# Patient Record
Sex: Female | Born: 1980 | Race: Black or African American | Hispanic: No | Marital: Married | Smoking: Never smoker
Health system: Southern US, Community
[De-identification: ages and names within clinical notes are randomized; demographics above are authoritative.]

## PROBLEM LIST (undated history)

## (undated) DIAGNOSIS — D573 Sickle-cell trait: Secondary | ICD-10-CM

## (undated) DIAGNOSIS — J45909 Unspecified asthma, uncomplicated: Secondary | ICD-10-CM

## (undated) DIAGNOSIS — D649 Anemia, unspecified: Secondary | ICD-10-CM

## (undated) DIAGNOSIS — D219 Benign neoplasm of connective and other soft tissue, unspecified: Secondary | ICD-10-CM

## (undated) HISTORY — DX: Benign neoplasm of connective and other soft tissue, unspecified: D21.9

## (undated) HISTORY — DX: Anemia, unspecified: D64.9

## (undated) HISTORY — PX: WISDOM TOOTH EXTRACTION: SHX21

## (undated) HISTORY — DX: Unspecified asthma, uncomplicated: J45.909

## (undated) HISTORY — PX: OTHER SURGICAL HISTORY: SHX169

## (undated) HISTORY — DX: Sickle-cell trait: D57.3

---

## 2019-04-03 NOTE — L&D Delivery Note (Signed)
Delivery Summary for Sharon Norman  Labor Events:   Preterm labor: No data found  Rupture date: 11/01/2019  Rupture time: 3:51 AM  Rupture type: Spontaneous Artificial Intact  Fluid Color: Clear Moderate Meconium  Induction: No data found  Augmentation: No data found  Complications: No data found  Cervical ripening: No data found No data found   No data found     Delivery:   Episiotomy: No data found  Lacerations: No data found  Repair suture: No data found  Repair # of packets: No data found  Blood loss (ml): 1083   Information for the patient's newborn:  Pallas, Wahlert [[id number]]    Delivery 11/01/2019 11:13 PM by  C-Section, Low Transverse Sex:  female Gestational Age: [redacted]w[redacted]d Delivery Clinician:   Living?:         APGARS  One minute Five minutes Ten minutes  Skin color:        Heart rate:        Grimace:        Muscle tone:        Breathing:        Totals: 8  8      Presentation/position:      Resuscitation:   Cord information:    Disposition of cord blood:     Blood gases sent?  Complications:   Placenta: Delivered:       appearance Newborn Measurements: Weight: 7 lb 11.1 oz (3490 g)  Height: 20.87"  Head circumference:    Chest circumference:    Other providers:    Additional  information: Forceps:   Vacuum:   Breech:   Observed anomalies        See Dr. Andreas Blower operative note for details of Cesarean delivery.    Rubie Maid, MD [facility]

## 2019-04-06 ENCOUNTER — Encounter: Payer: Self-pay | Admitting: Certified Nurse Midwife

## 2019-04-06 ENCOUNTER — Ambulatory Visit (INDEPENDENT_AMBULATORY_CARE_PROVIDER_SITE_OTHER): Payer: Self-pay | Admitting: Certified Nurse Midwife

## 2019-04-06 ENCOUNTER — Other Ambulatory Visit: Payer: Self-pay

## 2019-04-06 VITALS — BP 130/80 | HR 109 | Ht 63.5 in | Wt 144.3 lb

## 2019-04-06 DIAGNOSIS — Z113 Encounter for screening for infections with a predominantly sexual mode of transmission: Secondary | ICD-10-CM

## 2019-04-06 DIAGNOSIS — Z0283 Encounter for blood-alcohol and blood-drug test: Secondary | ICD-10-CM

## 2019-04-06 DIAGNOSIS — Z3401 Encounter for supervision of normal first pregnancy, first trimester: Secondary | ICD-10-CM

## 2019-04-06 MED ORDER — ONDANSETRON HCL 4 MG PO TABS: 4.0000 mg | ORAL_TABLET | Freq: Three times a day (TID) | ORAL | 0 refills | Status: DC | PRN

## 2019-04-06 NOTE — Progress Notes (Signed)
      Sharon Norman presents for NOB nurse intake visit. Pregnancy confirmation done at [facility], 03/09/19, with Leroy Sea, RN.  G1.  P0.  LMP 01/18/19.  EDD 10/29/2019.  Ga [redacted]w[redacted]d. Pregnancy education material explained and given.  0 cats in the home.  NOB labs ordered. BMI less than 30. TSH/HbgA1c not ordered. Sickle cell order due to race. Pt has Sickle Cell Trait. HIV and drug screen explained and ordered. Genetic screening discussed. Genetic testing; Ordered. Pt had Natera done during nurse intake.  PNV encouraged. Pt to follow up with provider in 2 weeks for NOB physical. [place] reviewed with pt. FMLA form signed and reviewed with pt.    BP 130/80   Pulse (!) 109   Ht 5' 3.5" (1.613 m)   Wt 144 lb 4.8 oz (65.5 kg)   LMP 01/18/2019   BMI 25.16 kg/m

## 2019-04-06 NOTE — Patient Instructions (Signed)
WHAT OB PATIENTS CAN EXPECT   Confirmation of pregnancy and ultrasound ordered if medically indicated-[redacted] weeks gestation  New OB (NOB) intake with nurse and New OB (NOB) labs- [redacted] weeks gestation  New OB (NOB) physical examination with provider- 11/[redacted] weeks gestation  Flu vaccine-[redacted] weeks gestation  Anatomy scan-[redacted] weeks gestation  Glucose tolerance test, blood work to test for anemia, T-dap vaccine-[redacted] weeks gestation  Vaginal swabs/cultures-STD/Group B strep-[redacted] weeks gestation  Appointments every 4 weeks until 28 weeks  Every 2 weeks from 28 weeks until 36 weeks  Weekly visits from 36 weeks until delivery  Morning Sickness  Morning sickness is when you feel sick to your stomach (nauseous) during pregnancy. You may feel sick to your stomach and throw up (vomit). You may feel sick in the morning, but you can feel this way at any time of day. Some women feel very sick to their stomach and cannot stop throwing up (hyperemesis gravidarum). Follow these instructions at home: Medicines  Take over-the-counter and prescription medicines only as told by your doctor. Do not take any medicines until you talk with your doctor about them first.  Taking multivitamins before getting pregnant can stop or lessen the harshness of morning sickness. Eating and drinking  Eat dry toast or crackers before getting out of bed.  Eat 5 or 6 small meals a day.  Eat dry and bland foods like rice and baked potatoes.  Do not eat greasy, fatty, or spicy foods.  Have someone cook for you if the smell of food causes you to feel sick or throw up.  If you feel sick to your stomach after taking prenatal vitamins, take them at night or with a snack.  Eat protein when you need a snack. Nuts, yogurt, and cheese are good choices.  Drink fluids throughout the day.  Try ginger ale made with real ginger, ginger tea made from fresh grated ginger, or ginger candies. General instructions  Do not use any products  that have nicotine or tobacco in them, such as cigarettes and e-cigarettes. If you need help quitting, ask your doctor.  Use an air purifier to keep the air in your house free of smells.  Get lots of fresh air.  Try to avoid smells that make you feel sick.  Try: ? Wearing a bracelet that is used for seasickness (acupressure wristband). ? Going to a doctor who puts thin needles into certain body points (acupuncture) to improve how you feel. Contact a doctor if:  You need medicine to feel better.  You feel dizzy or light-headed.  You are losing weight. Get help right away if:  You feel very sick to your stomach and cannot stop throwing up.  You pass out (faint).  You have very bad pain in your belly. Summary  Morning sickness is when you feel sick to your stomach (nauseous) during pregnancy.  You may feel sick in the morning, but you can feel this way at any time of day.  Making some changes to what you eat may help your symptoms go away. This information is not intended to replace advice given to you by your health care provider. Make sure you discuss any questions you have with your health care provider. Document Revised: 03/01/2017 Document Reviewed: 04/19/2016 Elsevier Patient Education  2020 [employer]. How a Baby Grows During Pregnancy  Pregnancy begins when a female's sperm enters a female's egg (fertilization). Fertilization usually happens in one of the tubes (fallopian tubes) that connect the ovaries to the  womb (uterus). The fertilized egg moves down the fallopian tube to the uterus. Once it reaches the uterus, it implants into the lining of the uterus and begins to grow. For the first 10 weeks, the fertilized egg is called an embryo. After 10 weeks, it is called a fetus. As the fetus continues to grow, it receives oxygen and nutrients through tissue (placenta) that grows to support the developing baby. The placenta is the life support system for the baby. It provides  oxygen and nutrition and removes waste. Learning as much as you can about your pregnancy and how your baby is developing can help you enjoy the experience. It can also make you aware of when there might be a problem and when to ask questions. How long does a typical pregnancy last? A pregnancy usually lasts 280 days, or about 40 weeks. Pregnancy is divided into three periods of growth, also called trimesters:  First trimester: 0-12 weeks.  Second trimester: 13-27 weeks.  Third trimester: 28-40 weeks. The day when your baby is ready to be born (full term) is your estimated date of delivery. How does my baby develop month by month? First month  The fertilized egg attaches to the inside of the uterus.  Some cells will form the placenta. Others will form the fetus.  The arms, legs, brain, spinal cord, lungs, and heart begin to develop.  At the end of the first month, the heart begins to beat. Second month  The bones, inner ear, eyelids, hands, and feet form.  The genitals develop.  By the end of 8 weeks, all major organs are developing. Third month  All of the internal organs are forming.  Teeth develop below the gums.  Bones and muscles begin to grow. The spine can flex.  The skin is transparent.  Fingernails and toenails begin to form.  Arms and legs continue to grow longer, and hands and feet develop.  The fetus is about 3 inches (7.6 cm) long. Fourth month  The placenta is completely formed.  The external sex organs, neck, outer ear, eyebrows, eyelids, and fingernails are formed.  The fetus can hear, swallow, and move its arms and legs.  The kidneys begin to produce urine.  The skin is covered with a white, waxy coating (vernix) and very fine hair (lanugo). Fifth month  The fetus moves around more and can be felt for the first time (quickening).  The fetus starts to sleep and wake up and may begin to suck its finger.  The nails grow to the end of the  fingers.  The organ in the digestive system that makes bile (gallbladder) functions and helps to digest nutrients.  If your baby is a girl, eggs are present in her ovaries. If your baby is a boy, testicles start to move down into his scrotum. Sixth month  The lungs are formed.  The eyes open. The brain continues to develop.  Your baby has fingerprints and toe prints. Your baby's hair grows thicker.  At the end of the second trimester, the fetus is about 9 inches (22.9 cm) long. Seventh month  The fetus kicks and stretches.  The eyes are developed enough to sense changes in light.  The hands can make a grasping motion.  The fetus responds to sound. Eighth month  All organs and body systems are fully developed and functioning.  Bones harden, and taste buds develop. The fetus may hiccup.  Certain areas of the brain are still developing. The skull remains soft.   Ninth month  The fetus gains about  lb (0.23 kg) each week.  The lungs are fully developed.  Patterns of sleep develop.  The fetus's head typically moves into a head-down position (vertex) in the uterus to prepare for birth.  The fetus weighs 6-9 lb (2.72-4.08 kg) and is 19-20 inches (48.26-50.8 cm) long. What can I do to have a healthy pregnancy and help my baby develop? General instructions  Take prenatal vitamins as directed by your health care provider. These include vitamins such as folic acid, iron, calcium, and vitamin D. They are important for healthy development.  Take medicines only as directed by your health care provider. Read labels and ask a pharmacist or your health care provider whether over-the-counter medicines, supplements, and prescription drugs are safe to take during pregnancy.  Keep all follow-up visits as directed by your health care provider. This is important. Follow-up visits include prenatal care and screening tests. How do I know if my baby is developing well? At each prenatal visit,  your health care provider will do several different tests to check on your health and keep track of your baby's development. These include:  Fundal height and position. ? Your health care provider will measure your growing belly from your pubic bone to the top of the uterus using a tape measure. ? Your health care provider will also feel your belly to determine your baby's position.  Heartbeat. ? An ultrasound in the first trimester can confirm pregnancy and show a heartbeat, depending on how far along you are. ? Your health care provider will check your baby's heart rate at every prenatal visit.  Second trimester ultrasound. ? This ultrasound checks your baby's development. It also may show your baby's gender. What should I do if I have concerns about my baby's development? Always talk with your health care provider about any concerns that you may have about your pregnancy and your baby. Summary  A pregnancy usually lasts 280 days, or about 40 weeks. Pregnancy is divided into three periods of growth, also called trimesters.  Your health care provider will monitor your baby's growth and development throughout your pregnancy.  Follow your health care provider's recommendations about taking prenatal vitamins and medicines during your pregnancy.  Talk with your health care provider if you have any concerns about your pregnancy or your developing baby. This information is not intended to replace advice given to you by your health care provider. Make sure you discuss any questions you have with your health care provider. Document Revised: 07/10/2018 Document Reviewed: 01/30/2017 Elsevier Patient Education  2020 [place] of Pregnancy  The first trimester of pregnancy is from week 1 until the end of week 13 (months 1 through 3). During this time, your baby will begin to develop inside you. At 6-8 weeks, the eyes and face are formed, and the heartbeat can be seen on  ultrasound. At the end of 12 weeks, all the baby's organs are formed. Prenatal care is all the medical care you receive before the birth of your baby. Make sure you get good prenatal care and follow all of your doctor's instructions. Follow these instructions at home: Medicines  Take over-the-counter and prescription medicines only as told by your doctor. Some medicines are safe and some medicines are not safe during pregnancy.  Take a prenatal vitamin that contains at least 600 micrograms (mcg) of folic acid.  If you have trouble pooping (constipation), take medicine that will make your stool soft (stool  softener) if your doctor approves. Eating and drinking   Eat regular, healthy meals.  Your doctor will tell you the amount of weight gain that is right for you.  Avoid raw meat and uncooked cheese.  If you feel sick to your stomach (nauseous) or throw up (vomit): ? Eat 4 or 5 small meals a day instead of 3 large meals. ? Try eating a few soda crackers. ? Drink liquids between meals instead of during meals.  To prevent constipation: ? Eat foods that are high in fiber, like fresh fruits and vegetables, whole grains, and beans. ? Drink enough fluids to keep your pee (urine) clear or pale yellow. Activity  Exercise only as told by your doctor. Stop exercising if you have cramps or pain in your lower belly (abdomen) or low back.  Do not exercise if it is too hot, too humid, or if you are in a place of great height (high altitude).  Try to avoid standing for long periods of time. Move your legs often if you must stand in one place for a long time.  Avoid heavy lifting.  Wear low-heeled shoes. Sit and stand up straight.  You can have sex unless your doctor tells you not to. Relieving pain and discomfort  Wear a good support bra if your breasts are sore.  Take warm water baths (sitz baths) to soothe pain or discomfort caused by hemorrhoids. Use hemorrhoid cream if your doctor says  it is okay.  Rest with your legs raised if you have leg cramps or low back pain.  If you have puffy, bulging veins (varicose veins) in your legs: ? Wear support hose or compression stockings as told by your doctor. ? Raise (elevate) your feet for 15 minutes, 3-4 times a day. ? Limit salt in your food. Prenatal care  Schedule your prenatal visits by the twelfth week of pregnancy.  Write down your questions. Take them to your prenatal visits.  Keep all your prenatal visits as told by your doctor. This is important. Safety  Wear your seat belt at all times when driving.  Make a list of emergency phone numbers. The list should include numbers for family, friends, the hospital, and police and fire departments. General instructions  Ask your doctor for a referral to a local prenatal class. Begin classes no later than at the start of month 6 of your pregnancy.  Ask for help if you need counseling or if you need help with nutrition. Your doctor can give you advice or tell you where to go for help.  Do not use hot tubs, steam rooms, or saunas.  Do not douche or use tampons or scented sanitary pads.  Do not cross your legs for long periods of time.  Avoid all herbs and alcohol. Avoid drugs that are not approved by your doctor.  Do not use any tobacco products, including cigarettes, chewing tobacco, and electronic cigarettes. If you need help quitting, ask your doctor. You may get counseling or other support to help you quit.  Avoid cat litter boxes and soil used by cats. These carry germs that can cause birth defects in the baby and can cause a loss of your baby (miscarriage) or stillbirth.  Visit your dentist. At home, brush your teeth with a soft toothbrush. Be gentle when you floss. Contact a doctor if:  You are dizzy.  You have mild cramps or pressure in your lower belly.  You have a nagging pain in your belly area.  You  continue to feel sick to your stomach, you throw up, or  you have watery poop (diarrhea).  You have a bad smelling fluid coming from your vagina.  You have pain when you pee (urinate).  You have increased puffiness (swelling) in your face, hands, legs, or ankles. Get help right away if:  You have a fever.  You are leaking fluid from your vagina.  You have spotting or bleeding from your vagina.  You have very bad belly cramping or pain.  You gain or lose weight rapidly.  You throw up blood. It may look like coffee grounds.  You are around people who have [place] measles, fifth disease, or chickenpox.  You have a very bad headache.  You have shortness of breath.  You have any kind of trauma, such as from a fall or a car accident. Summary  The first trimester of pregnancy is from week 1 until the end of week 13 (months 1 through 3).  To take care of yourself and your unborn baby, you will need to eat healthy meals, take medicines only if your doctor tells you to do so, and do activities that are safe for you and your baby.  Keep all follow-up visits as told by your doctor. This is important as your doctor will have to ensure that your baby is healthy and growing well. This information is not intended to replace advice given to you by your health care provider. Make sure you discuss any questions you have with your health care provider. Document Revised: 07/10/2018 Document Reviewed: 03/27/2016 Elsevier Patient Education  [place] of Pregnancy  The first trimester of pregnancy is from week 1 until the end of week 13 (months 1 through 3). During this time, your baby will begin to develop inside you. At 6-8 weeks, the eyes and face are formed, and the heartbeat can be seen on ultrasound. At the end of 12 weeks, all the baby's organs are formed. Prenatal care is all the medical care you receive before the birth of your baby. Make sure you get good prenatal care and follow all of your doctor's instructions. Follow  these instructions at home: Medicines  Take over-the-counter and prescription medicines only as told by your doctor. Some medicines are safe and some medicines are not safe during pregnancy.  Take a prenatal vitamin that contains at least 600 micrograms (mcg) of folic acid.  If you have trouble pooping (constipation), take medicine that will make your stool soft (stool softener) if your doctor approves. Eating and drinking   Eat regular, healthy meals.  Your doctor will tell you the amount of weight gain that is right for you.  Avoid raw meat and uncooked cheese.  If you feel sick to your stomach (nauseous) or throw up (vomit): ? Eat 4 or 5 small meals a day instead of 3 large meals. ? Try eating a few soda crackers. ? Drink liquids between meals instead of during meals.  To prevent constipation: ? Eat foods that are high in fiber, like fresh fruits and vegetables, whole grains, and beans. ? Drink enough fluids to keep your pee (urine) clear or pale yellow. Activity  Exercise only as told by your doctor. Stop exercising if you have cramps or pain in your lower belly (abdomen) or low back.  Do not exercise if it is too hot, too humid, or if you are in a place of great height (high altitude).  Try to avoid standing for long periods  of time. Move your legs often if you must stand in one place for a long time.  Avoid heavy lifting.  Wear low-heeled shoes. Sit and stand up straight.  You can have sex unless your doctor tells you not to. Relieving pain and discomfort  Wear a good support bra if your breasts are sore.  Take warm water baths (sitz baths) to soothe pain or discomfort caused by hemorrhoids. Use hemorrhoid cream if your doctor says it is okay.  Rest with your legs raised if you have leg cramps or low back pain.  If you have puffy, bulging veins (varicose veins) in your legs: ? Wear support hose or compression stockings as told by your doctor. ? Raise (elevate)  your feet for 15 minutes, 3-4 times a day. ? Limit salt in your food. Prenatal care  Schedule your prenatal visits by the twelfth week of pregnancy.  Write down your questions. Take them to your prenatal visits.  Keep all your prenatal visits as told by your doctor. This is important. Safety  Wear your seat belt at all times when driving.  Make a list of emergency phone numbers. The list should include numbers for family, friends, the hospital, and police and fire departments. General instructions  Ask your doctor for a referral to a local prenatal class. Begin classes no later than at the start of month 6 of your pregnancy.  Ask for help if you need counseling or if you need help with nutrition. Your doctor can give you advice or tell you where to go for help.  Do not use hot tubs, steam rooms, or saunas.  Do not douche or use tampons or scented sanitary pads.  Do not cross your legs for long periods of time.  Avoid all herbs and alcohol. Avoid drugs that are not approved by your doctor.  Do not use any tobacco products, including cigarettes, chewing tobacco, and electronic cigarettes. If you need help quitting, ask your doctor. You may get counseling or other support to help you quit.  Avoid cat litter boxes and soil used by cats. These carry germs that can cause birth defects in the baby and can cause a loss of your baby (miscarriage) or stillbirth.  Visit your dentist. At home, brush your teeth with a soft toothbrush. Be gentle when you floss. Contact a doctor if:  You are dizzy.  You have mild cramps or pressure in your lower belly.  You have a nagging pain in your belly area.  You continue to feel sick to your stomach, you throw up, or you have watery poop (diarrhea).  You have a bad smelling fluid coming from your vagina.  You have pain when you pee (urinate).  You have increased puffiness (swelling) in your face, hands, legs, or ankles. Get help right away  if:  You have a fever.  You are leaking fluid from your vagina.  You have spotting or bleeding from your vagina.  You have very bad belly cramping or pain.  You gain or lose weight rapidly.  You throw up blood. It may look like coffee grounds.  You are around people who have [place] measles, fifth disease, or chickenpox.  You have a very bad headache.  You have shortness of breath.  You have any kind of trauma, such as from a fall or a car accident. Summary  The first trimester of pregnancy is from week 1 until the end of week 13 (months 1 through 3).  To take care  of yourself and your unborn baby, you will need to eat healthy meals, take medicines only if your doctor tells you to do so, and do activities that are safe for you and your baby.  Keep all follow-up visits as told by your doctor. This is important as your doctor will have to ensure that your baby is healthy and growing well. This information is not intended to replace advice given to you by your health care provider. Make sure you discuss any questions you have with your health care provider. Document Revised: 07/10/2018 Document Reviewed: 03/27/2016 Elsevier Patient Education  2020 [employer]. Commonly Asked Questions During Pregnancy  Cats: A parasite can be excreted in cat feces.  To avoid exposure you need to have another person empty the little box.  If you must empty the litter box you will need to wear gloves.  Wash your hands after handling your cat.  This parasite can also be found in raw or undercooked meat so this should also be avoided.  Colds, Sore Throats, Flu: Please check your medication sheet to see what you can take for symptoms.  If your symptoms are unrelieved by these medications please call the office.  Dental Work: Most any dental work [employer] recommends is permitted.  X-rays should only be taken during the first trimester if absolutely necessary.  Your abdomen should be shielded with a  lead apron during all x-rays.  Please notify your provider prior to receiving any x-rays.  Novocaine is fine; gas is not recommended.  If your dentist requires a note from [place] prior to dental work please call the office and we will provide one for you.  Exercise: Exercise is an important part of staying healthy during your pregnancy.  You may continue most exercises you were accustomed to prior to pregnancy.  Later in your pregnancy you will most likely notice you have difficulty with activities requiring balance like riding a bicycle.  It is important that you listen to your body and avoid activities that put you at a higher risk of falling.  Adequate rest and staying well hydrated are a must!  If you have questions about the safety of specific activities ask your provider.    Exposure to Children with illness: Try to avoid obvious exposure; report any symptoms to [place] when noted,  If you have chicken pos, red measles or mumps, you should be immune to these diseases.   Please do not take any vaccines while pregnant unless you have checked with your OB provider.  Fetal Movement: After 28 weeks we recommend you do "kick counts" twice daily.  Lie or sit down in a calm quiet environment and count your baby movements "kicks".  You should feel your baby at least 10 times per hour.  If you have not felt 10 kicks within the first hour get up, walk around and have something sweet to eat or drink then repeat for an additional hour.  If count remains less than 10 per hour notify your provider.  Fumigating: Follow your pest control agent's advice as to how long to stay out of your home.  Ventilate the area well before re-entering.  Hemorrhoids:   Most over-the-counter preparations can be used during pregnancy.  Check your medication to see what is safe to use.  It is important to use a stool softener or fiber in your diet and to drink lots of liquids.  If hemorrhoids seem to be getting worse please call the office.  Hot Tubs:  Hot tubs Jacuzzis and saunas are not recommended while pregnant.  These increase your internal body temperature and should be avoided.  Intercourse:  Sexual intercourse is safe during pregnancy as long as you are comfortable, unless otherwise advised by your provider.  Spotting may occur after intercourse; report any bright red bleeding that is heavier than spotting.  Labor:  If you know that you are in labor, please go to the hospital.  If you are unsure, please call the office and let us help you decide what to do.  Lifting, straining, etc:  If your job requires heavy lifting or straining please check with your provider for any limitations.  Generally, you should not lift items heavier than that you can lift simply with your hands and arms (no back muscles)  Painting:  Paint fumes do not harm your pregnancy, but may make you ill and should be avoided if possible.  Latex or water based paints have less odor than oils.  Use adequate ventilation while painting.  Permanents & Hair Color:  Chemicals in hair dyes are not recommended as they cause increase hair dryness which can increase hair loss during pregnancy.  " Highlighting" and permanents are allowed.  Dye may be absorbed differently and permanents may not hold as well during pregnancy.  Sunbathing:  Use a sunscreen, as skin burns easily during pregnancy.  Drink plenty of fluids; avoid over heating.  Tanning Beds:  Because their possible side effects are still unknown, tanning beds are not recommended.  Ultrasound Scans:  Routine ultrasounds are performed at approximately 20 weeks.  You will be able to see your baby's general anatomy an if you would like to know the gender this can usually be determined as well.  If it is questionable when you conceived you may also receive an ultrasound early in your pregnancy for dating purposes.  Otherwise ultrasound exams are not routinely performed unless there is a medical necessity.  Although  you can request a scan we ask that you pay for it when conducted because insurance does not cover " patient request" scans.  Work: If your pregnancy proceeds without complications you may work until your due date, unless your physician or employer advises otherwise.  Round Ligament Pain/Pelvic Discomfort:  Sharp, shooting pains not associated with bleeding are fairly common, usually occurring in the second trimester of pregnancy.  They tend to be worse when standing up or when you remain standing for long periods of time.  These are the result of pressure of certain pelvic ligaments called "round ligaments".  Rest, Tylenol and heat seem to be the most effective relief.  As the womb and fetus grow, they rise out of the pelvis and the discomfort improves.  Please notify the office if your pain seems different than that described.  It may represent a more serious condition.  Common Medications Safe in Pregnancy  Acne:      Constipation:  Benzoyl Peroxide     Colace  Clindamycin      Dulcolax Suppository  Topica Erythromycin     Fibercon  Salicylic Acid      Metamucil         Miralax AVOID:        Senakot   Accutane    Cough:  Retin-A       Cough Drops  Tetracycline      Phenergan w/ Codeine if Rx  Minocycline      Robitussin (Plain & DM)  Antibiotics:  Crabs/Lice:  Ceclor       RID  Cephalosporins    AVOID:  E-Mycins      Kwell  Keflex  Macrobid/Macrodantin   Diarrhea:  Penicillin      Kao-Pectate  Zithromax      Imodium AD         PUSH FLUIDS AVOID:       Cipro     Fever:  Tetracycline      Tylenol (Regular or Extra  Minocycline       Strength)  Levaquin      Extra Strength-Do not          Exceed 8 tabs/24 hrs Caffeine:        '200mg'$ /day (equiv. To 1 cup of coffee or  approx. 3 12 oz sodas)         Gas: Cold/Hayfever:       Gas-X  Benadryl      Mylicon  Claritin       Phazyme  **Claritin-D        Chlor-Trimeton    Headaches:  Dimetapp      ASA-Free  Excedrin  Drixoral-Non-Drowsy     Cold Compress  Mucinex (Guaifenasin)     Tylenol (Regular or Extra  Sudafed/Sudafed-12 Hour     Strength)  **Sudafed PE Pseudoephedrine   Tylenol Cold & Sinus     Vicks Vapor Rub  Zyrtec  **AVOID if Problems With Blood Pressure         Heartburn: Avoid lying down for at least 1 hour after meals  Aciphex      Maalox     Rash:  Milk of Magnesia     Benadryl    Mylanta       1% Hydrocortisone Cream  Pepcid  Pepcid Complete   Sleep Aids:  Prevacid      Ambien   Prilosec       Benadryl  Rolaids       Chamomile Tea  Tums (Limit 4/day)     Unisom  Zantac       Tylenol PM         Warm milk-add vanilla or  Hemorrhoids:       Sugar for taste  Anusol/Anusol H.C.  (RX: Analapram 2.5%)  Sugar Substitutes:  Hydrocortisone OTC     Ok in moderation  Preparation H      Tucks        Vaseline lotion applied to tissue with wiping    Herpes:     Throat:  Acyclovir      Oragel  Famvir  Valtrex     Vaccines:         Flu Shot Leg Cramps:       *Gardasil  Benadryl      Hepatitis A         Hepatitis B Nasal Spray:       Pneumovax  Saline Nasal Spray     Polio Booster         Tetanus Nausea:       Tuberculosis test or PPD  Vitamin B6 25 mg TID   AVOID:    Dramamine      *Gardasil  Emetrol       Live Poliovirus  Ginger Root 250 mg QID    MMR (measles, mumps &  High Complex Carbs @ Bedtime    rebella)  Sea Bands-Accupressure    Varicella (Chickenpox)  Unisom 1/2 tab TID     *No known complications  If received before Pain:         Known pregnancy;   Darvocet       Resume series after  Lortab        Delivery  Percocet    Yeast:   Tramadol      Femstat  Tylenol 3      Gyne-lotrimin  Ultram       Monistat  Vicodin           MISC:         All Sunscreens           Hair Coloring/highlights          Insect Repellant's          (Including DEET)         Mystic Tans

## 2019-04-07 LAB — URINALYSIS, ROUTINE W REFLEX MICROSCOPIC
Bilirubin, UA: NEGATIVE
Glucose, UA: NEGATIVE
Ketones, UA: NEGATIVE
Leukocytes,UA: NEGATIVE
Nitrite, UA: NEGATIVE
Protein,UA: NEGATIVE
RBC, UA: NEGATIVE
Specific Gravity, UA: 1.019 (ref 1.005–1.030)
Urobilinogen, Ur: 0.2 mg/dL (ref 0.2–1.0)
pH, UA: 5 (ref 5.0–7.5)

## 2019-04-07 LAB — NICOTINE SCREEN, URINE: Cotinine Ql Scrn, Ur: NEGATIVE ng/mL

## 2019-04-07 LAB — DRUG PROFILE, UR, 9 DRUGS (LABCORP)
Amphetamines, Urine: NEGATIVE ng/mL
Barbiturate Quant, Ur: NEGATIVE ng/mL
Benzodiazepine Quant, Ur: NEGATIVE ng/mL
Cannabinoid Quant, Ur: NEGATIVE ng/mL
Cocaine (Metab.): NEGATIVE ng/mL
Methadone Screen, Urine: NEGATIVE ng/mL
Opiate Quant, Ur: NEGATIVE ng/mL
PCP Quant, Ur: NEGATIVE ng/mL
Propoxyphene: NEGATIVE ng/mL

## 2019-04-07 LAB — GC/CHLAMYDIA PROBE AMP
Chlamydia trachomatis, NAA: NEGATIVE
Neisseria Gonorrhoeae by PCR: NEGATIVE

## 2019-04-08 LAB — CULTURE, OB URINE

## 2019-04-08 LAB — HEMOGLOBIN FRAC.W/O SOLUBILITY
HGB C: 0 %
HGB S: 35.8 % — ABNORMAL HIGH
HGB VARIANT: 0 %
Hemoglobin A2 Quantitation: 4 % — ABNORMAL HIGH (ref 1.8–3.2)
Hemoglobin F Quantitation: 0 % (ref 0.0–2.0)
Hgb A: 60.2 % — ABNORMAL LOW (ref 96.4–98.8)

## 2019-04-08 LAB — OB RESULTS CONSOLE VARICELLA ZOSTER ANTIBODY, IGG: Varicella: NON-IMMUNE/NOT IMMUNE

## 2019-04-08 LAB — HGB SOLU + RFLX FRAC: Sickle Solubility Test - HGBRFX: POSITIVE — AB

## 2019-04-08 LAB — HIV ANTIBODY (ROUTINE TESTING W REFLEX): HIV Screen 4th Generation wRfx: NONREACTIVE

## 2019-04-08 LAB — URINE CULTURE, OB REFLEX

## 2019-04-08 LAB — ABO AND RH: Rh Factor: POSITIVE

## 2019-04-08 LAB — RPR: RPR Ser Ql: NONREACTIVE

## 2019-04-08 LAB — VARICELLA ZOSTER ANTIBODY, IGG: Varicella zoster IgG: <135 index — ABNORMAL LOW (ref >165)

## 2019-04-08 LAB — ANTIBODY SCREEN: Antibody Screen: NEGATIVE

## 2019-04-08 LAB — HEPATITIS B SURFACE ANTIGEN: Hepatitis B Surface Ag: NEGATIVE

## 2019-04-08 LAB — RUBELLA SCREEN: Rubella Antibodies, IGG: 1.49 index (ref >0.99)

## 2019-04-08 LAB — TOXOPLASMA ANTIBODIES- IGG AND  IGM
Toxoplasma Antibody- IgM: <3 AU/mL (ref 0.0–7.9)
Toxoplasma IgG Ratio: <3 IU/mL (ref 0.0–7.1)

## 2019-04-09 ENCOUNTER — Telehealth: Payer: Self-pay

## 2019-04-09 NOTE — Telephone Encounter (Signed)
Voicemail message left for patient- per AT positive for sickle cell disease. Informed her her partner needs to be tested- either at PCP or HD- also she is nonimmune to varicella and will need a booster after delivery.

## 2019-04-16 ENCOUNTER — Telehealth: Payer: Self-pay | Admitting: Certified Nurse Midwife

## 2019-04-16 NOTE — Telephone Encounter (Signed)
Pt alled in wanting to know the gender. Pt was told that she would have it by now .please advise

## 2019-04-17 ENCOUNTER — Telehealth: Payer: Self-pay

## 2019-04-17 NOTE — Telephone Encounter (Signed)
mychart message sent to patient- Sharon Norman results are still being processed according to [employer].

## 2019-04-20 ENCOUNTER — Telehealth: Payer: Self-pay

## 2019-04-20 NOTE — Telephone Encounter (Signed)
Pt called and aware of test results.

## 2019-04-21 ENCOUNTER — Ambulatory Visit (INDEPENDENT_AMBULATORY_CARE_PROVIDER_SITE_OTHER): Payer: Commercial Managed Care - PPO | Admitting: Certified Nurse Midwife

## 2019-04-21 ENCOUNTER — Encounter: Payer: Self-pay | Admitting: Certified Nurse Midwife

## 2019-04-21 ENCOUNTER — Other Ambulatory Visit: Payer: Self-pay

## 2019-04-21 VITALS — BP 137/75 | HR 108 | Wt 148.4 lb

## 2019-04-21 DIAGNOSIS — Z3401 Encounter for supervision of normal first pregnancy, first trimester: Secondary | ICD-10-CM

## 2019-04-21 DIAGNOSIS — Z124 Encounter for screening for malignant neoplasm of cervix: Secondary | ICD-10-CM

## 2019-04-21 LAB — POCT URINALYSIS DIPSTICK OB
Bilirubin, UA: NEGATIVE
Blood, UA: NEGATIVE
Glucose, UA: NEGATIVE
Ketones, UA: NEGATIVE
Leukocytes, UA: NEGATIVE
Nitrite, UA: NEGATIVE
POC,PROTEIN,UA: NEGATIVE
Spec Grav, UA: 1.015 (ref 1.010–1.025)
Urobilinogen, UA: 0.2 E.U./dL
pH, UA: 5 (ref 5.0–8.0)

## 2019-04-21 MED ORDER — FUSION PLUS PO CAPS: 1.0000 | ORAL_CAPSULE | Freq: Every day | ORAL | 3 refills | Status: AC

## 2019-04-21 NOTE — Lactation Note (Signed)
Lactation Consultation Note  Patient Name: Sharon Norman [id number] Date: 04/21/2019     Maternal Data    Feeding    LATCH Score                   Interventions    Lactation Tools Discussed/Used     Consult Status  LC Student, Levonne Hubert, went over benefits of breastfeeding and breastfeeding information per the Ready, Set baby curriculum.[name] asked how will she know if the baby is getting enough milk since she can not measure through breastfeeding. She also asked if she should feed on both breasts during a feed. Questions were answered and no further breastfeeding inquiries.    [name] [id number], 4:09 PM

## 2019-04-21 NOTE — Progress Notes (Signed)
NEW OB HISTORY AND PHYSICAL  SUBJECTIVE:       Sharon Norman is a 39 y.o. G1P0 female, Patient's last menstrual period was 01/18/2019., Estimated Date of Delivery: 10/29/19, [redacted]w[redacted]d, presents today for establishment of Prenatal Care. She has no unusual complaints.   Social: Married x 2 yrs, Lives with spouse No children Works: Environmental manager: none Denies smoking, drinking and drug use.     Gynecologic History Patient's last menstrual period was 01/18/2019. Normal Contraception: none Last Pap: 04/15/2014. Results were: normal  Obstetric History OB History  Gravida Para Term Preterm AB Living  1            SAB TAB Ectopic Multiple Live Births               # Outcome Date GA Lbr Len/2nd Weight Sex Delivery Anes PTL Lv  1 Current             Past Medical History:  Diagnosis Date  . Anemia   . Asthma   . Fibroid   . Sickle cell trait [facility])     Past Surgical History:  Procedure Laterality Date  . left ovary removed    . WISDOM TOOTH EXTRACTION      Current Outpatient Medications on File Prior to Visit  Medication Sig Dispense Refill  . ferrous sulfate 325 (65 FE) MG tablet Take 325 mg by mouth daily with breakfast.    . ondansetron (ZOFRAN) 4 MG tablet Take 1 tablet (4 mg total) by mouth every 8 (eight) hours as needed for nausea or vomiting. 20 tablet 0  . Prenatal Vit-Fe Fumarate-FA (PRENATAL MULTIVITAMIN) TABS tablet Take 1 tablet by mouth daily at 12 noon.     No current facility-administered medications on file prior to visit.    Allergies  Allergen Reactions  . Latex     Social History   Socioeconomic History  . Marital status: Married    Spouse name: Not on file  . Number of children: Not on file  . Years of education: Not on file  . Highest education level: Not on file  Occupational History  . Not on file  Tobacco Use  . Smoking status: Never Smoker  . Smokeless tobacco: Never Used  Substance and Sexual Activity  . Alcohol  use: Not Currently  . Drug use: Not Currently  . Sexual activity: Yes  Other Topics Concern  . Not on file  Social History Narrative  . Not on file   Social Determinants of Health   Financial Resource Strain:   . Difficulty of Paying Living Expenses: Not on file  Food Insecurity:   . Worried About Charity fundraiser in the Last Year: Not on file  . Ran Out of Food in the Last Year: Not on file  Transportation Needs:   . Lack of Transportation (Medical): Not on file  . Lack of Transportation (Non-Medical): Not on file  Physical Activity:   . Days of Exercise per Week: Not on file  . Minutes of Exercise per Session: Not on file  Stress:   . Feeling of Stress : Not on file  Social Connections:   . Frequency of Communication with Friends and Family: Not on file  . Frequency of Social Gatherings with Friends and Family: Not on file  . Attends Religious Services: Not on file  . Active Member of Clubs or Organizations: Not on file  . Attends Archivist Meetings: Not on file  . Marital Status:  Not on file  Intimate Partner Violence:   . Fear of Current or Ex-Partner: Not on file  . Emotionally Abused: Not on file  . Physically Abused: Not on file  . Sexually Abused: Not on file    Family History  Problem Relation Age of Onset  . Sickle cell anemia Mother   . Cataracts Mother   . Healthy Father     The following portions of the patient's history were reviewed and updated as appropriate: allergies, current medications, past OB history, past medical history, past surgical history, past family history, past social history, and problem list.    OBJECTIVE: Initial Physical Exam (New OB)  GENERAL APPEARANCE: alert, well appearing, in no apparent distress, oriented to person, place and time HEAD: normocephalic, atraumatic MOUTH: mucous membranes moist, pharynx normal without lesions THYROID: no thyromegaly or masses present BREASTS: no masses noted, no significant  tenderness, no palpable axillary nodes, no skin changes LUNGS: clear to auscultation, no wheezes, rales or rhonchi, symmetric air entry HEART: regular rate and rhythm, no murmurs ABDOMEN: soft, nontender, nondistended, no abnormal masses, no epigastric pain EXTREMITIES: no redness or tenderness in the calves or thighs SKIN: normal coloration and turgor, no rashes LYMPH NODES: no adenopathy palpable NEUROLOGIC: alert, oriented, normal speech, no focal findings or movement disorder noted  PELVIC EXAM EXTERNAL GENITALIA: normal appearing vulva with no masses, tenderness or lesions VAGINA: no abnormal discharge or lesions CERVIX: no lesions or cervical motion tenderness, polyp just barely sticking out of os.  UTERUS: gravid ADNEXA: no masses palpable and nontender OB EXAM PELVIMETRY: appears adequate RECTUM: exam not indicated  ASSESSMENT: Normal pregnancy  PLAN: Prenatal care See ordersNew OB counseling: The patient has been given an overview regarding routine prenatal care. Recommendations regarding diet, weight gain, and exercise in pregnancy were given. Prenatal testing, optional genetic testing,carrier screening,  and ultrasound use in pregnancy were reviewed.  Panorama testing completed . Benefits of Breast Feeding were discussed. The patient is encouraged to consider nursing her baby post partum. Follow up 4 wks   Philip Aspen, CNM

## 2019-04-21 NOTE — Addendum Note (Signed)
Addended by: Hildred Priest on: 04/21/2019 04:00 PM   Modules accepted: Orders

## 2019-04-21 NOTE — Patient Instructions (Signed)

## 2019-04-22 ENCOUNTER — Other Ambulatory Visit (HOSPITAL_COMMUNITY)
Admission: RE | Admit: 2019-04-22 | Discharge: 2019-04-22 | Disposition: A | Discharge status: Home / Self Care | Payer: Commercial Managed Care - PPO | Source: Ambulatory Visit | Attending: Certified Nurse Midwife | Admitting: Certified Nurse Midwife

## 2019-04-22 DIAGNOSIS — Z3401 Encounter for supervision of normal first pregnancy, first trimester: Secondary | ICD-10-CM | POA: Diagnosis present

## 2019-04-22 DIAGNOSIS — Z124 Encounter for screening for malignant neoplasm of cervix: Secondary | ICD-10-CM | POA: Insufficient documentation

## 2019-04-22 LAB — CBC
Hematocrit: 34.1 % (ref 34.0–46.6)
Hemoglobin: 11.2 g/dL (ref 11.1–15.9)
MCH: 25.7 pg — ABNORMAL LOW (ref 26.6–33.0)
MCHC: 32.8 g/dL (ref 31.5–35.7)
MCV: 78 fL — ABNORMAL LOW (ref 79–97)
Platelets: 300 10*3/uL (ref 150–450)
RBC: 4.35 x10E6/uL (ref 3.77–5.28)
RDW: 18.5 % — ABNORMAL HIGH (ref 11.7–15.4)
WBC: 14.5 10*3/uL — ABNORMAL HIGH (ref 3.4–10.8)

## 2019-04-22 NOTE — Addendum Note (Signed)
Addended by: Hildred Priest on: 04/22/2019 09:54 AM   Modules accepted: Orders

## 2019-04-28 LAB — CYTOLOGY - PAP
Comment: NEGATIVE
High risk HPV: NEGATIVE

## 2019-04-30 ENCOUNTER — Telehealth: Payer: Self-pay | Admitting: Certified Nurse Midwife

## 2019-04-30 NOTE — Telephone Encounter (Signed)
Called pt to let them know that we have to have two  separate appointments for her Return ob visit and the colpo. For insurance to file it has to be 2 different appointments.  I left a message letting the pt know I moved her return ob visit  Back to  2/16 and left  her colpo on 2/17.

## 2019-05-19 ENCOUNTER — Encounter: Payer: Commercial Managed Care - PPO | Admitting: Certified Nurse Midwife

## 2019-05-20 ENCOUNTER — Encounter: Payer: Commercial Managed Care - PPO | Admitting: Certified Nurse Midwife

## 2019-05-20 ENCOUNTER — Other Ambulatory Visit: Payer: Self-pay

## 2019-05-20 ENCOUNTER — Ambulatory Visit (INDEPENDENT_AMBULATORY_CARE_PROVIDER_SITE_OTHER): Payer: Commercial Managed Care - PPO | Admitting: Obstetrics and Gynecology

## 2019-05-20 ENCOUNTER — Encounter: Payer: Self-pay | Admitting: Obstetrics and Gynecology

## 2019-05-20 VITALS — BP 128/82 | HR 108 | Ht 63.5 in | Wt 153.0 lb

## 2019-05-20 DIAGNOSIS — R87612 Low grade squamous intraepithelial lesion on cytologic smear of cervix (LGSIL): Secondary | ICD-10-CM | POA: Diagnosis not present

## 2019-05-20 NOTE — Progress Notes (Signed)
Referring Provider:    HPI:  Sharon Norman is a 39 y.o.  G1P0  who presents today for evaluation and management of abnormal cervical cytology.    Dysplasia History:  LGSIL    Patient states that she thinks she had an abnormal Pap smear during her teen years but no treatment in the next time she had a Pap it was then normal.  Patient is [redacted] weeks pregnant   ROS:  Pertinent items are noted in HPI.  OB History  Gravida Para Term Preterm AB Living  1            SAB TAB Ectopic Multiple Live Births               # Outcome Date GA Lbr Len/2nd Weight Sex Delivery Anes PTL Lv  1 Current             Past Medical History:  Diagnosis Date  . Anemia   . Asthma   . Fibroid   . Sickle cell trait [facility])     Past Surgical History:  Procedure Laterality Date  . left ovary removed    . WISDOM TOOTH EXTRACTION      SOCIAL HISTORY: Social History   Substance and Sexual Activity  Alcohol Use Not Currently   Social History   Substance and Sexual Activity  Drug Use Not Currently     Family History  Problem Relation Age of Onset  . Sickle cell anemia Mother   . Cataracts Mother   . Healthy Father     ALLERGIES:  Latex  She has a current medication list which includes the following prescription(s): ferrous sulfate, fusion plus, ondansetron, and prenatal multivitamin.  Physical Exam: -Vitals:  BP 128/82   Pulse (!) 108   Ht 5' 3.5" (1.613 m)   Wt 153 lb (69.4 kg)   LMP 01/18/2019   BMI 26.68 kg/m   PROCEDURE: Colposcopy performed with 4% acetic acid after verbal consent obtained                           -Aceto-white Lesions Location(s): 9-12 o'clock off of the cervix not near T-zone. T-zone appears completely normal              -Biopsy performed at none              -ECC indicated and performed: No.     -Biopsy sites made hemostatic with pressure and Monsel's solution   -Satisfactory colposcopy: Yes.      -Evidence of Invasive cervical CA :  NO  ASSESSMENT:   Sharon Norman is a 39 y.o. G1P0 here for  1. Low grade squamous intraepithelial lesion on cytologic smear of cervix (LGSIL)   .  PLAN: 1.  I discussed the grading system of pap smears and HPV high risk viral types.  Recommend follow-up colposcopy approximately 3 months postpartum-anticipate biopsies at that time.  No orders of the defined types were placed in this encounter.          F/U  No follow-ups on file.  Jeannie Fend ,MD 05/20/2019,12:01 PM

## 2019-05-22 ENCOUNTER — Ambulatory Visit (INDEPENDENT_AMBULATORY_CARE_PROVIDER_SITE_OTHER): Payer: Commercial Managed Care - PPO | Admitting: Certified Nurse Midwife

## 2019-05-22 ENCOUNTER — Other Ambulatory Visit: Payer: Self-pay

## 2019-05-22 VITALS — BP 123/78 | HR 100 | Wt 152.3 lb

## 2019-05-22 DIAGNOSIS — Z3492 Encounter for supervision of normal pregnancy, unspecified, second trimester: Secondary | ICD-10-CM

## 2019-05-22 DIAGNOSIS — Z283 Underimmunization status: Secondary | ICD-10-CM

## 2019-05-22 DIAGNOSIS — R87612 Low grade squamous intraepithelial lesion on cytologic smear of cervix (LGSIL): Secondary | ICD-10-CM

## 2019-05-22 DIAGNOSIS — Z3689 Encounter for other specified antenatal screening: Secondary | ICD-10-CM

## 2019-05-22 DIAGNOSIS — O09899 Supervision of other high risk pregnancies, unspecified trimester: Secondary | ICD-10-CM

## 2019-05-22 DIAGNOSIS — Z2839 Other underimmunization status: Secondary | ICD-10-CM | POA: Insufficient documentation

## 2019-05-22 DIAGNOSIS — Z3A17 17 weeks gestation of pregnancy: Secondary | ICD-10-CM

## 2019-05-22 LAB — POCT URINALYSIS DIPSTICK OB
Bilirubin, UA: NEGATIVE
Blood, UA: NEGATIVE
Glucose, UA: NEGATIVE
Ketones, UA: NEGATIVE
Leukocytes, UA: NEGATIVE
Nitrite, UA: NEGATIVE
POC,PROTEIN,UA: NEGATIVE
Spec Grav, UA: 1.01 (ref 1.010–1.025)
Urobilinogen, UA: 0.2 E.U./dL
pH, UA: 5 (ref 5.0–8.0)

## 2019-05-22 NOTE — Patient Instructions (Signed)
Common Medications Safe in Pregnancy  Acne:      Constipation:  Benzoyl Peroxide     Colace  Clindamycin      Dulcolax Suppository  Topica Erythromycin     Fibercon  Salicylic Acid      Metamucil         Miralax AVOID:        Senakot   Accutane    Cough:  Retin-A       Cough Drops  Tetracycline      Phenergan w/ Codeine if Rx  Minocycline      Robitussin (Plain & DM)  Antibiotics:     Crabs/Lice:  Ceclor       RID  Cephalosporins    AVOID:  E-Mycins      Kwell  Keflex  Macrobid/Macrodantin   Diarrhea:  Penicillin      Kao-Pectate  Zithromax      Imodium AD         PUSH FLUIDS AVOID:       Cipro     Fever:  Tetracycline      Tylenol (Regular or Extra  Minocycline       Strength)  Levaquin      Extra Strength-Do not          Exceed 8 tabs/24 hrs Caffeine:        <200mg/day (equiv. To 1 cup of coffee or  approx. 3 12 oz sodas)         Gas: Cold/Hayfever:       Gas-X  Benadryl      Mylicon  Claritin       Phazyme  **Claritin-D        Chlor-Trimeton    Headaches:  Dimetapp      ASA-Free Excedrin  Drixoral-Non-Drowsy     Cold Compress  Mucinex (Guaifenasin)     Tylenol (Regular or Extra  Sudafed/Sudafed-12 Hour     Strength)  **Sudafed PE Pseudoephedrine   Tylenol Cold & Sinus     Vicks Vapor Rub  Zyrtec  **AVOID if Problems With Blood Pressure         Heartburn: Avoid lying down for at least 1 hour after meals  Aciphex      Maalox     Rash:  Milk of Magnesia     Benadryl    Mylanta       1% Hydrocortisone Cream  Pepcid  Pepcid Complete   Sleep Aids:  Prevacid      Ambien   Prilosec       Benadryl  Rolaids       Chamomile Tea  Tums (Limit 4/day)     Unisom  Zantac       Tylenol PM         Warm milk-add vanilla or  Hemorrhoids:       Sugar for taste  Anusol/Anusol H.C.  (RX: Analapram 2.5%)  Sugar Substitutes:  Hydrocortisone OTC     Ok in moderation  Preparation H      Tucks        Vaseline lotion applied to tissue with  wiping    Herpes:     Throat:  Acyclovir      Oragel  Famvir  Valtrex     Vaccines:         Flu Shot Leg Cramps:       *Gardasil  Benadryl      Hepatitis A         Hepatitis B Nasal Spray:         Pneumovax  Saline Nasal Spray     Polio Booster         Tetanus Nausea:       Tuberculosis test or PPD  Vitamin B6 25 mg TID   AVOID:    Dramamine      *Gardasil  Emetrol       Live Poliovirus  Ginger Root 250 mg QID    MMR (measles, mumps &  High Complex Carbs @ Bedtime    rebella)  Sea Bands-Accupressure    Varicella (Chickenpox)  Unisom 1/2 tab TID     *No known complications           If received before Pain:         Known pregnancy;   Darvocet       Resume series after  Lortab        Delivery  Percocet    Yeast:   Tramadol      Femstat  Tylenol 3      Gyne-lotrimin  Ultram       Monistat  Vicodin           MISC:         All Sunscreens           Hair Coloring/highlights          Insect Repellant's          (Including DEET)         Mystic Tans Heartburn During Pregnancy  Heartburn is pain or discomfort in the throat or chest. It may cause a burning feeling. It happens when stomach acid moves up into the tube that carries food from your mouth to your stomach (esophagus). Heartburn is common during pregnancy. It usually goes away or gets better after giving birth. Follow these instructions at home: Eating and drinking  Do not drink alcohol while you are pregnant.  Figure out which foods and beverages make you feel worse, and avoid them.  Beverages that you may want to avoid include: ? Coffee and tea (with or without caffeine). ? Energy drinks and sports drinks. ? Bubbly (carbonated) drinks or sodas. ? Citrus fruit juices.  Foods that you may want to avoid include: ? Chocolate and cocoa. ? Peppermint and mint flavorings. ? Garlic, onions, and horseradish. ? Spicy and acidic foods. These include peppers, chili powder, curry powder, vinegar, hot sauces, and barbecue  sauce. ? Citrus fruits, such as oranges, lemons, and limes. ? Tomato-based foods, such as red sauce, chili, and salsa. ? Fried and fatty foods, such as donuts, french fries, potato chips, and high-fat dressings. ? High-fat meats, such as hot dogs, cold cuts, sausage, ham, and bacon. ? High-fat dairy items, such as whole milk, butter, and cheese.  Eat small meals often, instead of large meals.  Avoid drinking a lot of liquid with your meals.  Avoid eating meals during the 2-3 hours before you go to bed.  Avoid lying down right after you eat.  Do not exercise right after you eat. Medicines  Take over-the-counter and prescription medicines only as told by your doctor.  Do not take aspirin, ibuprofen, or other NSAIDs unless your doctor tells you to do that.  Your doctor may tell you to avoid medicines that have sodium bicarbonate in them. General instructions   If told, raise the head of your bed about 6 inches (15 cm). You can do this by putting blocks under the legs. Sleeping with more pillows does not help with heartburn.  Do not  use any products that contain nicotine or tobacco, such as cigarettes and e-cigarettes. If you need help quitting, ask your doctor.  Wear loose-fitting clothing.  Try to lower your stress, such as with yoga or meditation. If you need help, ask your doctor.  Stay at a healthy weight. If you are overweight, work with your doctor to safely lose weight.  Keep all follow-up visits as told by your doctor. This is important. Contact a doctor if:  You get new symptoms.  Your symptoms do not get better with treatment.  You have weight loss and you do not know why.  You have trouble swallowing.  You make loud sounds when you breathe (wheeze).  You have a cough that does not go away.  You have heartburn often for more than 2 weeks.  You feel sick to your stomach (nauseous), and this does not get better with treatment.  You are throwing up  (vomiting), and this does not get better with treatment.  You have pain in your belly (abdomen). Get help right away if:  You have very bad chest pain that spreads to your arm, neck, or jaw.  You feel sweaty, dizzy, or light-headed.  You have trouble breathing.  You have pain when swallowing.  You throw up and your throw-up looks like blood or coffee grounds.  Your poop (stool) is bloody or black. This information is not intended to replace advice given to you by your health care provider. Make sure you discuss any questions you have with your health care provider. Document Revised: 07/10/2018 Document Reviewed: 12/05/2015 Elsevier Patient Education  [place].

## 2019-05-22 NOTE — Progress Notes (Signed)
ROB-Doing well. Education regarding home treatment measures for round ligament pain, GERD and leg swelling. Enrolled in Ready, Set, Baby and Natural Childbirth Classes. Naming daughter, Trenton Founds. Colposcopy with Dr Amalia Hailey on 05/20/19, will need repeat 3 months postpartum. Anticipatory guidance regarding course of prenatal care. Reviewed red flag symptoms and when to call. RTC x 3-4 weeks for ANATOMY SCAN and ROB or sooner if needed.

## 2019-05-23 DIAGNOSIS — R87612 Low grade squamous intraepithelial lesion on cytologic smear of cervix (LGSIL): Secondary | ICD-10-CM | POA: Insufficient documentation

## 2019-05-30 ENCOUNTER — Telehealth: Payer: Commercial Managed Care - PPO

## 2019-05-31 ENCOUNTER — Emergency Department: Payer: Commercial Managed Care - PPO

## 2019-05-31 ENCOUNTER — Encounter: Payer: Self-pay | Admitting: Emergency Medicine

## 2019-05-31 ENCOUNTER — Other Ambulatory Visit: Payer: Self-pay

## 2019-05-31 ENCOUNTER — Emergency Department
Admission: EM | Admit: 2019-05-31 | Discharge: 2019-05-31 | Disposition: A | Discharge status: Home / Self Care | Payer: Commercial Managed Care - PPO | Attending: Emergency Medicine | Admitting: Emergency Medicine

## 2019-05-31 DIAGNOSIS — N83201 Unspecified ovarian cyst, right side: Secondary | ICD-10-CM

## 2019-05-31 DIAGNOSIS — O3412 Maternal care for benign tumor of corpus uteri, second trimester: Secondary | ICD-10-CM | POA: Diagnosis not present

## 2019-05-31 DIAGNOSIS — J45909 Unspecified asthma, uncomplicated: Secondary | ICD-10-CM | POA: Insufficient documentation

## 2019-05-31 DIAGNOSIS — O3482 Maternal care for other abnormalities of pelvic organs, second trimester: Secondary | ICD-10-CM | POA: Insufficient documentation

## 2019-05-31 DIAGNOSIS — O99891 Other specified diseases and conditions complicating pregnancy: Secondary | ICD-10-CM | POA: Diagnosis present

## 2019-05-31 DIAGNOSIS — Z91040 Latex allergy status: Secondary | ICD-10-CM | POA: Diagnosis not present

## 2019-05-31 DIAGNOSIS — R102 Pelvic and perineal pain unspecified side: Secondary | ICD-10-CM

## 2019-05-31 DIAGNOSIS — R1031 Right lower quadrant pain: Secondary | ICD-10-CM

## 2019-05-31 DIAGNOSIS — O99012 Anemia complicating pregnancy, second trimester: Secondary | ICD-10-CM | POA: Insufficient documentation

## 2019-05-31 DIAGNOSIS — O99512 Diseases of the respiratory system complicating pregnancy, second trimester: Secondary | ICD-10-CM | POA: Insufficient documentation

## 2019-05-31 DIAGNOSIS — Z79899 Other long term (current) drug therapy: Secondary | ICD-10-CM | POA: Diagnosis not present

## 2019-05-31 DIAGNOSIS — D573 Sickle-cell trait: Secondary | ICD-10-CM | POA: Diagnosis not present

## 2019-05-31 DIAGNOSIS — Z3A18 18 weeks gestation of pregnancy: Secondary | ICD-10-CM | POA: Insufficient documentation

## 2019-05-31 DIAGNOSIS — D259 Leiomyoma of uterus, unspecified: Secondary | ICD-10-CM

## 2019-05-31 LAB — COMPREHENSIVE METABOLIC PANEL
ALT: 29 U/L (ref 0–44)
AST: 26 U/L (ref 15–41)
Albumin: 3.6 g/dL (ref 3.5–5.0)
Alkaline Phosphatase: 34 U/L — ABNORMAL LOW (ref 38–126)
Anion gap: 9 (ref 5–15)
BUN: 9 mg/dL (ref 6–20)
CO2: 22 mmol/L (ref 22–32)
Calcium: 9.3 mg/dL (ref 8.9–10.3)
Chloride: 106 mmol/L (ref 98–111)
Creatinine, Ser: 0.63 mg/dL (ref 0.44–1.00)
GFR calc Af Amer: >60 mL/min (ref >60)
GFR calc non Af Amer: >60 mL/min (ref >60)
Glucose, Bld: 133 mg/dL — ABNORMAL HIGH (ref 70–99)
Potassium: 3.5 mmol/L (ref 3.5–5.1)
Sodium: 137 mmol/L (ref 135–145)
Total Bilirubin: 0.2 mg/dL — ABNORMAL LOW (ref 0.3–1.2)
Total Protein: 6.9 g/dL (ref 6.5–8.1)

## 2019-05-31 LAB — WET PREP, GENITAL
Clue Cells Wet Prep HPF POC: NONE SEEN
Sperm: NONE SEEN
Trich, Wet Prep: NONE SEEN
Yeast Wet Prep HPF POC: NONE SEEN

## 2019-05-31 LAB — CBC
HCT: 35.2 % — ABNORMAL LOW (ref 36.0–46.0)
Hemoglobin: 11.5 g/dL — ABNORMAL LOW (ref 12.0–15.0)
MCH: 26.4 pg (ref 26.0–34.0)
MCHC: 32.7 g/dL (ref 30.0–36.0)
MCV: 80.9 fL (ref 80.0–100.0)
Platelets: 237 10*3/uL (ref 150–400)
RBC: 4.35 MIL/uL (ref 3.87–5.11)
RDW: 17.7 % — ABNORMAL HIGH (ref 11.5–15.5)
WBC: 14.4 10*3/uL — ABNORMAL HIGH (ref 4.0–10.5)
nRBC: 0 % (ref 0.0–0.2)

## 2019-05-31 LAB — CHLAMYDIA/NGC RT PCR (ARMC ONLY)
Chlamydia Tr: NOT DETECTED
N gonorrhoeae: NOT DETECTED

## 2019-05-31 LAB — URINALYSIS, COMPLETE (UACMP) WITH MICROSCOPIC
Bilirubin Urine: NEGATIVE
Glucose, UA: >=500 mg/dL — AB
Hgb urine dipstick: NEGATIVE
Ketones, ur: NEGATIVE mg/dL
Leukocytes,Ua: NEGATIVE
Nitrite: NEGATIVE
Protein, ur: NEGATIVE mg/dL
Specific Gravity, Urine: 1.011 (ref 1.005–1.030)
pH: 6 (ref 5.0–8.0)

## 2019-05-31 LAB — LIPASE, BLOOD: Lipase: 26 U/L (ref 11–51)

## 2019-05-31 LAB — HCG, QUANTITATIVE, PREGNANCY: hCG, Beta Chain, Quant, S: 12217 m[IU]/mL — ABNORMAL HIGH (ref <5)

## 2019-05-31 LAB — POCT PREGNANCY, URINE: Preg Test, Ur: POSITIVE — AB

## 2019-05-31 LAB — OB RESULTS CONSOLE GC/CHLAMYDIA
Chlamydia: NEGATIVE
Gonorrhea: NEGATIVE

## 2019-05-31 MED ORDER — SODIUM CHLORIDE 0.9 % IV BOLUS
1000.0000 mL | Freq: Once | INTRAVENOUS | Status: AC
  Administered 2019-05-31: 1000 mL via INTRAVENOUS

## 2019-05-31 MED ORDER — OXYCODONE-ACETAMINOPHEN 5-325 MG PO TABS: 1.0000 | ORAL_TABLET | Freq: Four times a day (QID) | ORAL | 0 refills | Status: DC | PRN

## 2019-05-31 MED ORDER — MORPHINE SULFATE (PF) 4 MG/ML IV SOLN
4.0000 mg | Freq: Once | INTRAVENOUS | Status: AC
  Administered 2019-05-31: 4 mg via INTRAVENOUS
  Filled 2019-05-31: qty 1

## 2019-05-31 MED ORDER — ONDANSETRON HCL 4 MG/2ML IJ SOLN
4.0000 mg | Freq: Once | INTRAMUSCULAR | Status: AC
  Administered 2019-05-31: 4 mg via INTRAVENOUS
  Filled 2019-05-31: qty 2

## 2019-05-31 MED ORDER — MORPHINE SULFATE (PF) 2 MG/ML IV SOLN
2.0000 mg | Freq: Once | INTRAVENOUS | Status: AC
  Administered 2019-05-31: 2 mg via INTRAVENOUS
  Filled 2019-05-31: qty 1

## 2019-05-31 NOTE — ED Triage Notes (Signed)
Pt arrives POV to triage with c/o low pelvic pain x 2 days. Pt has a EDD of 10/29/19.Marland Kitchen Pt reports no DC or bleeding at this time but does report some emesis.

## 2019-05-31 NOTE — Discharge Instructions (Signed)
Call your OB/GYN tomorrow to arrange for follow-up within the next week.  You should take Tylenol up to [phone] mg every 6 hours (with a maximum of 4 g/day) as the primary medication for pain.  We have prescribed a small amount of Percocet for more severe breakthrough pain, however you should use this as little as possible.  Return to the ER for new, worsening, or persistent severe pain, fever, vomiting, weakness, bleeding, or any other new or worsening symptoms that concern you.

## 2019-05-31 NOTE — ED Notes (Signed)
Pt given cup of water, graham crackers, and shaved ice

## 2019-05-31 NOTE — ED Notes (Signed)
Patient to ultrasound

## 2019-05-31 NOTE — ED Provider Notes (Signed)
[facility] Emergency Department Provider Note   ____________________________________________   First MD Initiated Contact with Patient 05/31/19 [phone]     (approximate)  I have reviewed the triage vital signs and the nursing notes.   HISTORY  Chief Complaint Abdominal Pain    HPI Sharon Norman is a 39 y.o. female G1, P0 approximately [redacted] weeks pregnant who presents to the ED from home with a chief complaint of right pelvic/abdominal pain.  Patient reports pain x2 days.  Describes crampy-like pain, nonradiating and progressive.  Vomited once yesterday; patient has not vomited since first trimester.  Denies fever, chills, cough, chest pain, shortness of breath, dysuria, diarrhea.  Denies vaginal bleeding.       Past Medical History:  Diagnosis Date  . Anemia   . Asthma   . Fibroid   . Sickle cell trait [facility])     Patient Active Problem List   Diagnosis Date Noted  . LGSIL on Pap smear of cervix 05/23/2019  . Susceptible to varicella (non-immune), currently pregnant 05/22/2019    Past Surgical History:  Procedure Laterality Date  . left ovary removed    . WISDOM TOOTH EXTRACTION      Prior to Admission medications   Medication Sig Start Date End Date Taking? Authorizing Provider  ferrous sulfate 325 (65 FE) MG tablet Take 325 mg by mouth daily with breakfast.    [provider]  ondansetron (ZOFRAN) 4 MG tablet Take 1 tablet (4 mg total) by mouth every 8 (eight) hours as needed for nausea or vomiting. Patient not taking: Reported on 05/22/2019 04/06/19   Philip Aspen, CNM  Prenatal Vit-Fe Fumarate-FA (PRENATAL MULTIVITAMIN) TABS tablet Take 1 tablet by mouth daily at 12 noon.    [provider]    Allergies Latex  Family History  Problem Relation Age of Onset  . Sickle cell anemia Mother   . Cataracts Mother   . Healthy Father     Social History Social History   Tobacco Use  . Smoking status: Never Smoker  .  Smokeless tobacco: Never Used  Substance Use Topics  . Alcohol use: Not Currently  . Drug use: Not Currently    Review of Systems  Constitutional: No fever/chills Eyes: No visual changes. ENT: No sore throat. Cardiovascular: Denies chest pain. Respiratory: Denies shortness of breath. Gastrointestinal: Positive for right pelvic/right lower quadrant abdominal pain.  Positive for 1 episode of vomiting.  No diarrhea.  No constipation. Genitourinary: Negative for dysuria. Musculoskeletal: Negative for back pain. Skin: Negative for rash. Neurological: Negative for headaches, focal weakness or numbness.   ____________________________________________   PHYSICAL EXAM:  VITAL SIGNS: ED Triage Vitals  Enc Vitals Group     BP 05/31/19 0147 131/84     Pulse Rate 05/31/19 0147 (!) 105     Resp 05/31/19 0147 18     Temp 05/31/19 0147 98.4 F (36.9 C)     Temp Source 05/31/19 0147 Oral     SpO2 05/31/19 0147 99 %     Weight 05/31/19 0149 153 lb (69.4 kg)     Height 05/31/19 0149 5\' 3"  (1.6 m)     Head Circumference --      Peak Flow --      Pain Score 05/31/19 0147 8     Pain Loc --      Pain Edu? --      Excl. in [place]? --     Constitutional: Alert and oriented. Well appearing and in no  acute distress. Eyes: Conjunctivae are normal. PERRL. EOMI. Head: Atraumatic. Nose: No congestion/rhinnorhea. Mouth/Throat: Mucous membranes are moist.  Oropharynx non-erythematous. Neck: No stridor.   Cardiovascular: Normal rate, regular rhythm. Grossly normal heart sounds.  Good peripheral circulation. Respiratory: Normal respiratory effort.  No retractions. Lungs CTAB. Gastrointestinal: Gravid.  Soft and mildly tender to palpation right lower quadrant/adnexa without rebound or guarding. No distention. No abdominal bruits. No CVA tenderness. Musculoskeletal: No lower extremity tenderness nor edema.  No joint effusions. Neurologic:  Normal speech and language. No gross focal neurologic deficits  are appreciated. No gait instability. Skin:  Skin is warm, dry and intact. No rash noted. Psychiatric: Mood and affect are normal. Speech and behavior are normal.  ____________________________________________   LABS (all labs ordered are listed, but only abnormal results are displayed)  Labs Reviewed  WET PREP, GENITAL - Abnormal; Notable for the following components:      Result Value   WBC, Wet Prep HPF POC MODERATE (*)    All other components within normal limits  COMPREHENSIVE METABOLIC PANEL - Abnormal; Notable for the following components:   Glucose, Bld 133 (*)    Alkaline Phosphatase 34 (*)    Total Bilirubin 0.2 (*)    All other components within normal limits  CBC - Abnormal; Notable for the following components:   WBC 14.4 (*)    Hemoglobin 11.5 (*)    HCT 35.2 (*)    RDW 17.7 (*)    All other components within normal limits  URINALYSIS, COMPLETE (UACMP) WITH MICROSCOPIC - Abnormal; Notable for the following components:   Color, Urine YELLOW (*)    APPearance CLEAR (*)    Glucose, UA >=500 (*)    Bacteria, UA RARE (*)    All other components within normal limits  HCG, QUANTITATIVE, PREGNANCY - Abnormal; Notable for the following components:   hCG, Beta Chain, Quant, S 12,217 (*)    All other components within normal limits  POCT PREGNANCY, URINE - Abnormal; Notable for the following components:   Preg Test, Ur POSITIVE (*)    All other components within normal limits  CHLAMYDIA/NGC RT PCR ([facility] ONLY)  LIPASE, BLOOD  POC URINE PREG, ED   ____________________________________________  EKG  None ____________________________________________  RADIOLOGY  ED MD interpretation: Ultrasound demonstrates multiple uterine fibroids, probable corpus luteum cyst within the right ovary  Official radiology report(s): US OB Limited  Result Date: 05/31/2019 CLINICAL DATA:  Pelvic pain EXAM: LIMITED OBSTETRIC ULTRASOUND FINDINGS: Number of Fetuses: 1 Heart Rate:   145 bpm Movement: Yes Presentation: Cephalic Placental Location: Anterior Previa: No Amniotic Fluid (Subjective):  Within normal limits. BPD: 4.2 cm 18 w  5 d MATERNAL FINDINGS: Cervix:  Appears closed. Uterus/Adnexae: Multiple uterine fibroid seen. The largest is anterior and subserosal measuring 4.4 x 3.8 x 5.1 cm. A probable corpus luteum cyst within the right ovary. The left ovary is surgically absent. IMPRESSION: Single live intrauterine pregnancy measuring 18 weeks 5 days. Multiple intrauterine fibroids. This exam is performed on an emergent basis and does not comprehensively evaluate fetal size, dating, or anatomy; follow-up complete OB US should be considered if further fetal assessment is warranted. Electronically Signed   By: Prudencio Pair M.D.   On: 05/31/2019 03:20    ____________________________________________   PROCEDURES  Procedure(s) performed (including Critical Care):  Procedures   ____________________________________________   INITIAL IMPRESSION / ASSESSMENT AND PLAN / ED COURSE  As part of my medical decision making, I reviewed the following data within the electronic  MEDICAL RECORD NUMBER Nursing notes reviewed and incorporated, Labs reviewed, Old chart reviewed, Radiograph reviewed and Notes from prior ED visits     Sharon Norman was evaluated in Emergency Department on 05/31/2019 for the symptoms described in the history of present illness. She was evaluated in the context of the global COVID-19 pandemic, which necessitated consideration that the patient might be at risk for infection with the SARS-CoV-2 virus that causes COVID-19. Institutional protocols and algorithms that pertain to the evaluation of patients at risk for COVID-19 are in a state of rapid change based on information released by regulatory bodies including the CDC and federal and state organizations. These policies and algorithms were followed during the patient's care in the ED.    39 year old G1, P0  approximately [redacted] weeks pregnant who presents with right pelvic/lower abdominal pain. Differential diagnosis includes, but is not limited to, ovarian cyst, ovarian torsion, acute appendicitis, diverticulitis, urinary tract infection/pyelonephritis, endometriosis, bowel obstruction, colitis, renal colic, gastroenteritis, hernia, fibroids, endometriosis, pregnancy related pain including ectopic pregnancy, etc.  Laboratory and urinalysis results demonstrate moderate leukocytosis.  Ultrasound demonstrates 18-week 5-day IUP with multiple fibroids and probable right ovarian cyst.  This is most likely the cause of patient's pain; however, she does have moderate leukocytosis with appendix intact.  Will obtain MRI abdomen to evaluate for appendicitis.   Clinical Course as of May 30 657  Sun May 31, 2019  0659 Patient currently in MRI.  Care transferred to Dr. Cherylann Banas at change of shift.  Anticipate discharge home if MRI is negative for appendicitis.   [JS]    Clinical Course User Index [JS] Paulette Blanch, MD     ____________________________________________   FINAL CLINICAL IMPRESSION(S) / ED DIAGNOSES  Final diagnoses:  Right lower quadrant abdominal pain  Pelvic pain in female  Leiomyoma of uterus affecting pregnancy in second trimester  Cyst of right ovary     ED Discharge Orders    None       Note:  This document was prepared using Dragon voice recognition software and may include unintentional dictation errors.   Paulette Blanch, MD 05/31/19 0700

## 2019-05-31 NOTE — ED Notes (Signed)
Pt taken to Radiology dept for ordered scans

## 2019-05-31 NOTE — ED Provider Notes (Signed)
-----------------------------------------   9:58 AM on 05/31/2019 -----------------------------------------  I took over care of this patient from Dr. Beather Arbour.  The patient was pending an MRI to evaluate for the etiology of right lower quadrant pain.  The MRI reveals hydronephrosis and hydroureter on the right, likely due to compression between the fibroid and the pelvic wall.  The appendix was not definitively identified but there are no inflammatory findings to suggest appendicitis.  I reviewed the lab work-up.  The patient's WBC count is 14, however it was also fourteen 1 month ago so I do not suspect any true elevation.  I discussed the case and imaging findings with Dr. Dahlia Byes from general surgery.  He advises that given the lack of inflammatory findings, the likelihood of acute appendicitis is extremely low and he would not recommend additional work-up or observation.  I also discussed the case and imaging results with Dr. Marcelline Mates from OB/GYN.  She recommended follow-up in the clinic this week for further evaluation of the fibroid, but there is no need for acute intervention other than pain control.  On reassessment, the patient appears very comfortable.  She has mild tenderness in the right lower quadrant.  I counseled her on the results of the work-up.  At this time, she is stable for discharge.  I recommended that she take Tylenol, however I have prescribed a small amount of Percocet to be taken if needed for more severe breakthrough pain.  I advised the patient to contact her OB/GYN for follow-up this week as per Dr. Andreas Blower instructions, and I gave her thorough return precautions.  She expressed understanding and agreement with the plan.   Arta Silence, MD 05/31/19 1001

## 2019-05-31 NOTE — ED Notes (Signed)
Pt updated on wait. Pt verbalizes understanding.  

## 2019-06-01 ENCOUNTER — Telehealth: Payer: Self-pay | Admitting: Certified Nurse Midwife

## 2019-06-01 NOTE — Telephone Encounter (Signed)
Pt sent a mychart message wanting to be seen. The pt said she is having lower abdominal pain. Please advise

## 2019-06-05 ENCOUNTER — Ambulatory Visit (INDEPENDENT_AMBULATORY_CARE_PROVIDER_SITE_OTHER): Payer: Commercial Managed Care - PPO | Admitting: Certified Nurse Midwife

## 2019-06-05 ENCOUNTER — Other Ambulatory Visit: Payer: Self-pay

## 2019-06-05 VITALS — BP 114/82 | HR 102 | Wt 155.1 lb

## 2019-06-05 DIAGNOSIS — O3412 Maternal care for benign tumor of corpus uteri, second trimester: Secondary | ICD-10-CM

## 2019-06-05 DIAGNOSIS — D259 Leiomyoma of uterus, unspecified: Secondary | ICD-10-CM | POA: Insufficient documentation

## 2019-06-05 DIAGNOSIS — R102 Pelvic and perineal pain: Secondary | ICD-10-CM

## 2019-06-05 DIAGNOSIS — O26892 Other specified pregnancy related conditions, second trimester: Secondary | ICD-10-CM

## 2019-06-05 DIAGNOSIS — Z3A19 19 weeks gestation of pregnancy: Secondary | ICD-10-CM

## 2019-06-05 LAB — POCT URINALYSIS DIPSTICK OB
Bilirubin, UA: NEGATIVE
Ketones, UA: NEGATIVE
Leukocytes, UA: NEGATIVE
Nitrite, UA: NEGATIVE
Spec Grav, UA: 1.02 (ref 1.010–1.025)
Urobilinogen, UA: 0.2 E.U./dL
pH, UA: 6 (ref 5.0–8.0)

## 2019-06-05 NOTE — Progress Notes (Signed)
OB-Patient here to follow up after 2/28 ED visit, c/o still having intermittent abdominal pain due to fibroids.  Taking Tylenol with no relief.

## 2019-06-05 NOTE — Progress Notes (Signed)
Subjective:   Sharon Norman is a 39 y.o. G1P0 [redacted]w[redacted]d being seen today for work in problem obstetrical visit.  Patient reports right lower pelvic pain, no relief with tylenol.   Seen in ED on 05/30/2018, three (3) fibroids present on MRI.   Denies contractions, vaginal bleeding or leaking of fluid.   The following portions of the patient's history were reviewed and updated as appropriate: allergies, current medications, past family history, past medical history, past social history, past surgical history and problem list.   Review of Systems:  ROS negative except as noted above. Information obtained from patient.   Objective:   BP 114/82   Pulse (!) 102   Wt 155 lb 1.6 oz (70.4 kg)   LMP 01/18/2019   BMI 27.47 kg/m   FHT: Fetal Heart Rate (bpm): 143  Fetal Movement: Movement: Absent    Abdomen:  soft, gravid, appropriate for gestational age,tender-right lower quadrant    Assessment:   Pregnancy:  G1P0 at [redacted]w[redacted]d  1. [redacted] weeks gestation of pregnancy  - POC Urinalysis Dipstick OB  2. Uterine fibroids affecting pregnancy in second trimester  - Ambulatory referral to Physical Therapy  3. Pelvic pain affecting pregnancy in second trimester   Plan:   Rx Ultram, see orders.  Encouraged use of abdominal support.   Referral to Physical Therapy, see orders.  Preterm labor symptoms: vaginal bleeding, contractions and leaking of fluid reviewed in detail.  Fetal movement precautions reviewed.  Follow up as previously scheduled or sooner if needed.   Diona Fanti, CNM [facility], [facility]

## 2019-06-05 NOTE — Patient Instructions (Addendum)
Second Trimester of Pregnancy  The second trimester is from week 14 through week 27 (month 4 through 6). This is often the time in pregnancy that you feel your best. Often times, morning sickness has lessened or quit. You may have more energy, and you may get hungry more often. Your unborn baby is growing rapidly. At the end of the sixth month, he or she is about 9 inches long and weighs about 1 pounds. You will likely feel the baby move between 18 and 20 weeks of pregnancy. Follow these instructions at home: Medicines  Take over-the-counter and prescription medicines only as told by your doctor. Some medicines are safe and some medicines are not safe during pregnancy.  Take a prenatal vitamin that contains at least 600 micrograms (mcg) of folic acid.  If you have trouble pooping (constipation), take medicine that will make your stool soft (stool softener) if your doctor approves. Eating and drinking   Eat regular, healthy meals.  Avoid raw meat and uncooked cheese.  If you get low calcium from the food you eat, talk to your doctor about taking a daily calcium supplement.  Avoid foods that are high in fat and sugars, such as fried and sweet foods.  If you feel sick to your stomach (nauseous) or throw up (vomit): ? Eat 4 or 5 small meals a day instead of 3 large meals. ? Try eating a few soda crackers. ? Drink liquids between meals instead of during meals.  To prevent constipation: ? Eat foods that are high in fiber, like fresh fruits and vegetables, whole grains, and beans. ? Drink enough fluids to keep your pee (urine) clear or pale yellow. Activity  Exercise only as told by your doctor. Stop exercising if you start to have cramps.  Do not exercise if it is too hot, too humid, or if you are in a place of great height (high altitude).  Avoid heavy lifting.  Wear low-heeled shoes. Sit and stand up straight.  You can continue to have sex unless your doctor tells you not  to. Relieving pain and discomfort  Wear a good support bra if your breasts are tender.  Take warm water baths (sitz baths) to soothe pain or discomfort caused by hemorrhoids. Use hemorrhoid cream if your doctor approves.  Rest with your legs raised if you have leg cramps or low back pain.  If you develop puffy, bulging veins (varicose veins) in your legs: ? Wear support hose or compression stockings as told by your doctor. ? Raise (elevate) your feet for 15 minutes, 3-4 times a day. ? Limit salt in your food. Prenatal care  Write down your questions. Take them to your prenatal visits.  Keep all your prenatal visits as told by your doctor. This is important. Safety  Wear your seat belt when driving.  Make a list of emergency phone numbers, including numbers for family, friends, the hospital, and police and fire departments. General instructions  Ask your doctor about the right foods to eat or for help finding a counselor, if you need these services.  Ask your doctor about local prenatal classes. Begin classes before month 6 of your pregnancy.  Do not use hot tubs, steam rooms, or saunas.  Do not douche or use tampons or scented sanitary pads.  Do not cross your legs for long periods of time.  Visit your dentist if you have not done so. Use a soft toothbrush to brush your teeth. Floss gently.  Avoid all smoking, herbs,   and alcohol. Avoid drugs that are not approved by your doctor.  Do not use any products that contain nicotine or tobacco, such as cigarettes and e-cigarettes. If you need help quitting, ask your doctor.  Avoid cat litter boxes and soil used by cats. These carry germs that can cause birth defects in the baby and can cause a loss of your baby (miscarriage) or stillbirth. Contact a doctor if:  You have mild cramps or pressure in your lower belly.  You have pain when you pee (urinate).  You have bad smelling fluid coming from your vagina.  You continue to  feel sick to your stomach (nauseous), throw up (vomit), or have watery poop (diarrhea).  You have a nagging pain in your belly area.  You feel dizzy. Get help right away if:  You have a fever.  You are leaking fluid from your vagina.  You have spotting or bleeding from your vagina.  You have severe belly cramping or pain.  You lose or gain weight rapidly.  You have trouble catching your breath and have chest pain.  You notice sudden or extreme puffiness (swelling) of your face, hands, ankles, feet, or legs.  You have not felt the baby move in over an hour.  You have severe headaches that do not go away when you take medicine.  You have trouble seeing. Summary  The second trimester is from week 14 through week 27 (months 4 through 6). This is often the time in pregnancy that you feel your best.  To take care of yourself and your unborn baby, you will need to eat healthy meals, take medicines only if your doctor tells you to do so, and do activities that are safe for you and your baby.  Call your doctor if you get sick or if you notice anything unusual about your pregnancy. Also, call your doctor if you need help with the right food to eat, or if you want to know what activities are safe for you. This information is not intended to replace advice given to you by your health care provider. Make sure you discuss any questions you have with your health care provider. Document Revised: 07/11/2018 Document Reviewed: 04/24/2016 Elsevier Patient Education  2020 [place]. Pelvic Mass, Female  A pelvic mass is an abnormal growth in the pelvis. The pelvis is the area between your hip bones. It includes the bladder, rectum, uterus, and ovaries. A pelvic mass may be found during a routine pelvic exam or while performing an MRI, CT scan, or ultrasound for other problems of the abdomen. What are common types of pelvic masses? Pelvic masses include:  Ovarian cysts. These are  fluid-filled sacs that form on an ovary.  Tumors. These may be cancerous (malignant) or noncancerous (benign). Noncancerous tumors in the uterus are called uterine fibroids.  Ectopic pregnancy. This is when the fertilized egg attaches (implants) outside the uterus.  Infections. What type of testing may be needed? Your health care provider may recommend that you have tests to diagnose the cause of the pelvic mass. The following tests may be done if a pelvic mass is found:  Physical exam.  Blood tests.  X-rays.  Ultrasound.  CT scan.  MRI.  A surgery to look inside your abdomen with cameras (laparoscopy).  A biopsy that is performed with a needle or during laparoscopy or surgery. In some cases, what seemed like a pelvic mass may actually be something else, such as a mass in one of the organs  that is near the pelvis, an infection (abscess), or scar tissue (adhesions) that formed after a surgery. Tests and physical exams may be done once, or they may be done regularly for a period of time. Tests and exams that are done regularly will help monitor whether the mass or tissue change is growing and becoming a concern. What are common treatments? Treatment is not always needed for this condition. Your health care provider may recommend careful monitoring (watchful waiting) and regular tests and exams. Treatment will depend on the cause of the mass. Follow these instructions at home:  What you need to do at home will depend on the cause of the mass. Follow the instructions that your health care provider gives to you. In general: ? Keep all follow-up visits as directed by your health care provider. This is important. ? Take over-the-counter and prescription medicines only as directed by your health care provider. ? If you were prescribed an antibiotic medicine, take it as told by your health care provider. Do not stop taking the antibiotic even if you start to feel better. ? Follow any  restrictions that are given to you by your health care provider.  Try to stay calm, and be sure to ask questions. Make sure you understand the recommendations for monitoring and whether there is a reason for concern. Contact a health care provider if you:  Develop new symptoms.  Note changes in the size, shape, or position of your mass.  Are unable to have a bowel movement.  Bruise or bleed easily. Get help right away if you:  Vomit bright red blood or vomit material that looks like coffee grounds.  Have blood in your stools, or the stools turn black and tarry.  Have an abnormal or increased amount of vaginal bleeding.  Have a fever or chills.  Develop sudden or worsening pain that is not relieved by medicine.  Feel dizzy or weak.  Feel light-headed or you faint.  Feel that the mass has suddenly gotten larger.  Develop severe bloating in your abdomen or your pelvis.  Cannot pass any urine. Summary  A pelvic mass is an abnormal growth in the pelvis. The pelvis is the area between your hip bones. It includes the bladder, rectum, uterus, and ovaries.  Pelvic masses include ovarian cysts, tumors, ectopic pregnancy, or infections.  Your health care provider may recommend that you have tests to diagnose the cause of the pelvic mass.  Treatment will depend on the cause of the mass. This information is not intended to replace advice given to you by your health care provider. Make sure you discuss any questions you have with your health care provider. Document Revised: 04/10/2017 Document Reviewed: 04/10/2017 Elsevier Patient Education  [place]. Tramadol tablets What is this medicine? TRAMADOL (TRA ma dole) is a pain reliever. It is used to treat moderate to severe pain in adults. This medicine may be used for other purposes; ask your health care provider or pharmacist if you have questions. COMMON BRAND NAME(S): Ultram What should I tell my health care provider  before I take this medicine? They need to know if you have any of these conditions:  brain tumor  depression  drug abuse or addiction  head injury  if you frequently drink alcohol containing drinks  kidney disease or trouble passing urine  liver disease  lung disease, asthma, or breathing problems  seizures or epilepsy  suicidal thoughts, plans, or attempt; a previous suicide attempt by you or a  family member  an unusual or allergic reaction to tramadol, codeine, other medicines, foods, dyes, or preservatives  pregnant or trying to get pregnant  breast-feeding How should I use this medicine? Take this medicine by mouth with a full glass of water. Follow the directions on the prescription label. You can take it with or without food. If it upsets your stomach, take it with food. Do not take your medicine more often than directed. A special MedGuide will be given to you by the pharmacist with each prescription and refill. Be sure to read this information carefully each time. Talk to your pediatrician regarding the use of this medicine in children. Special care may be needed. Overdosage: If you think you have taken too much of this medicine contact a poison control center or emergency room at once. NOTE: This medicine is only for you. Do not share this medicine with others. What if I miss a dose? If you miss a dose, take it as soon as you can. If it is almost time for your next dose, take only that dose. Do not take double or extra doses. What may interact with this medicine? Do not take this medication with any of the following medicines:  MAOIs like Carbex, Eldepryl, Marplan, Nardil, and Parnate This medicine may also interact with the following medications:  alcohol  antihistamines for allergy, cough and cold  certain medicines for anxiety or sleep  certain medicines for depression like amitriptyline, fluoxetine, sertraline  certain medicines for migraine headache  like almotriptan, eletriptan, frovatriptan, naratriptan, rizatriptan, sumatriptan, zolmitriptan  certain medicines for seizures like carbamazepine, oxcarbazepine, phenobarbital, primidone  certain medicines that treat or prevent blood clots like warfarin  digoxin  furazolidone  general anesthetics like halothane, isoflurane, methoxyflurane, propofol  linezolid  local anesthetics like lidocaine, pramoxine, tetracaine  medicines that relax muscles for surgery  other narcotic medicines for pain or cough  phenothiazines like chlorpromazine, mesoridazine, prochlorperazine, thioridazine  procarbazine This list may not describe all possible interactions. Give your health care provider a list of all the medicines, herbs, non-prescription drugs, or dietary supplements you use. Also tell them if you smoke, drink alcohol, or use illegal drugs. Some items may interact with your medicine. What should I watch for while using this medicine? Tell your health care provider if your pain does not go away, if it gets worse, or if you have new or a different type of pain. You may develop tolerance to this drug. Tolerance means that you will need a higher dose of the drug for pain relief. Tolerance is normal and is expected if you take this drug for a long time. Do not suddenly stop taking your drug because you may develop a severe reaction. Your body becomes used to the drug. This does NOT mean you are addicted. Addiction is a behavior related to getting and using a drug for a nonmedical reason. If you have pain, you have a medical reason to take pain drug. Your health care provider will tell you how much drug to take. If your health care provider wants you to stop the drug, the dose will be slowly lowered over time to avoid any side effects. If you take other drugs that also cause drowsiness like other narcotic pain drugs, benzodiazepines, or other drugs for sleep, you may have more side effects. Give your  health care provider a list of all drugs you use. He or she will tell you how much drug to take. Do not take more drug than  directed. Get emergency help right away if you have trouble breathing or are unusually tired or sleepy. Talk to your health care provider about naloxone and how to get it. Naloxone is an emergency drug used for an opioid overdose. An overdose can happen if you take too much opioid. It can also happen if an opioid is taken with some other drugs or substances, like alcohol. Know the symptoms of an overdose, like trouble breathing, unusually tired or sleepy, or not being able to respond or wake up. Make sure to tell caregivers and close contacts where it is stored. Make sure they know how to use it. After naloxone is given, you must get emergency help right away. Naloxone is a temporary treatment. Repeat doses may be needed. This drug may cause serious skin reactions. They can happen weeks to months after starting the drug. Contact your health care provider right away if you notice fevers or flu-like symptoms with a rash. The rash may be red or purple and then turn into blisters or peeling of the skin. Or, you might notice a red rash with swelling of the face, lips, or lymph nodes in your neck or under your arms. You may get drowsy or dizzy. Do not drive, use machinery, or do anything that needs mental alertness until you know how this drug affects you. Do not stand up or sit up quickly, especially if you are an older patient. This reduces the risk of dizzy or fainting spells. Alcohol may interfere with the effect of this drug. Avoid alcoholic drinks. This drug will cause constipation. If you do not have a bowel movement for 3 days, call your health care provider. Your mouth may get dry. Chewing sugarless gum or sucking hard candy and drinking plenty of water may help. Contact your health care provider if the problem does not go away or is severe. What side effects may I notice from  receiving this medicine? Side effects that you should report to your doctor or health care professional as soon as possible:  allergic reactions like skin rash, itching or hives, swelling of the face, lips, or tongue  breathing problems  confusion  redness, blistering, peeling or loosening of the skin, including inside the mouth  seizures  signs and symptoms of low blood pressure like dizziness; feeling faint or lightheaded, falls; unusually weak or tired  trouble passing urine or change in the amount of urine Side effects that usually do not require medical attention (report to your doctor or health care professional if they continue or are bothersome):  constipation  dry mouth  nausea, vomiting  tiredness This list may not describe all possible side effects. Call your doctor for medical advice about side effects. You may report side effects to FDA at 1-800-FDA-1088. Where should I keep my medicine? Keep out of the reach of children. This medicine may cause accidental overdose and death if it taken by other adults, children, or pets. Mix any unused medicine with a substance like cat litter or coffee grounds. Then throw the medicine away in a sealed container like a sealed bag or a coffee can with a lid. Do not use the medicine after the expiration date. Store at room temperature between 15 and 30 degrees C (59 and 86 degrees F). NOTE: This sheet is a summary. It may not cover all possible information. If you have questions about this medicine, talk to your doctor, pharmacist, or health care provider.  2020 Elsevier/Gold Standard (2018-10-27 13:08:25)

## 2019-06-07 MED ORDER — TRAMADOL HCL 50 MG PO TABS: 50.0000 mg | ORAL_TABLET | Freq: Four times a day (QID) | ORAL | 0 refills | Status: DC | PRN

## 2019-06-10 ENCOUNTER — Other Ambulatory Visit: Payer: Self-pay

## 2019-06-10 ENCOUNTER — Ambulatory Visit (INDEPENDENT_AMBULATORY_CARE_PROVIDER_SITE_OTHER): Payer: Commercial Managed Care - PPO

## 2019-06-10 ENCOUNTER — Ambulatory Visit (INDEPENDENT_AMBULATORY_CARE_PROVIDER_SITE_OTHER): Payer: Commercial Managed Care - PPO | Admitting: Certified Nurse Midwife

## 2019-06-10 VITALS — BP 120/86 | HR 103 | Wt 156.3 lb

## 2019-06-10 DIAGNOSIS — Z3492 Encounter for supervision of normal pregnancy, unspecified, second trimester: Secondary | ICD-10-CM

## 2019-06-10 DIAGNOSIS — Z3689 Encounter for other specified antenatal screening: Secondary | ICD-10-CM | POA: Diagnosis not present

## 2019-06-10 LAB — POCT URINALYSIS DIPSTICK OB
Bilirubin, UA: NEGATIVE
Blood, UA: NEGATIVE
Glucose, UA: NEGATIVE
Ketones, UA: NEGATIVE
Leukocytes, UA: NEGATIVE
Nitrite, UA: NEGATIVE
POC,PROTEIN,UA: NEGATIVE
Spec Grav, UA: 1.02 (ref 1.010–1.025)
Urobilinogen, UA: 0.2 E.U./dL
pH, UA: 5 (ref 5.0–8.0)

## 2019-06-10 NOTE — Progress Notes (Signed)
ROB doing well. Feels movement. Anatomy u/s today. Results reviewed.( See below) discussed anterior placenta and fetal movement. All questions answered follow up 4 wk with Sharyn Lull.   Patient Name: Sharon Norman DOB: [birth year] MRN: [id number] ULTRASOUND REPORT  Location: [facility] Date of Service: 06/10/2019   Indications:Anatomy Ultrasound   Findings:  [name] intrauterine pregnancy is visualized with FHR at 151 BPM. Biometrics give an (U/S) Gestational age of [redacted]w[redacted]d and an (U/S) EDD of 10/25/2019; this correlates with the clinically established Estimated Date of Delivery: 10/29/19  Fetal presentation is Breech.  EFW: 359 g ( 13 oz).   Placenta: anterior. Grade: 1 AFI: subjectively normal.  Anatomic survey is complete and normal; Gender - female.    Right Ovary is normal in appearance. Left Ovary is normal appearance. Survey of the adnexa demonstrates no adnexal masses. There is no free peritoneal fluid in the cul de sac.  Impression: 1. [redacted]w[redacted]d Viable Singleton Intrauterine pregnancy by U/S. 2. (U/S) EDD is consistent with Clinically established Estimated Date of Delivery: 10/29/19 . 3. Normal Anatomy Scan  Recommendations:  1.Clinical correlation with the patient's History and Physical Exam.   Jenine M. Albertine Grates    RDMS

## 2019-06-10 NOTE — Patient Instructions (Signed)

## 2019-06-24 ENCOUNTER — Ambulatory Visit: Payer: Commercial Managed Care - PPO

## 2019-07-02 NOTE — Telephone Encounter (Signed)
Error

## 2019-07-09 ENCOUNTER — Ambulatory Visit (INDEPENDENT_AMBULATORY_CARE_PROVIDER_SITE_OTHER): Payer: Commercial Managed Care - PPO | Admitting: Certified Nurse Midwife

## 2019-07-09 ENCOUNTER — Other Ambulatory Visit: Payer: Self-pay

## 2019-07-09 VITALS — BP 110/82 | HR 108 | Wt 160.0 lb

## 2019-07-09 DIAGNOSIS — O26899 Other specified pregnancy related conditions, unspecified trimester: Secondary | ICD-10-CM | POA: Insufficient documentation

## 2019-07-09 DIAGNOSIS — Z3A24 24 weeks gestation of pregnancy: Secondary | ICD-10-CM

## 2019-07-09 DIAGNOSIS — O26892 Other specified pregnancy related conditions, second trimester: Secondary | ICD-10-CM

## 2019-07-09 DIAGNOSIS — O1202 Gestational edema, second trimester: Secondary | ICD-10-CM | POA: Insufficient documentation

## 2019-07-09 DIAGNOSIS — G5600 Carpal tunnel syndrome, unspecified upper limb: Secondary | ICD-10-CM

## 2019-07-09 DIAGNOSIS — D259 Leiomyoma of uterus, unspecified: Secondary | ICD-10-CM

## 2019-07-09 DIAGNOSIS — Z3492 Encounter for supervision of normal pregnancy, unspecified, second trimester: Secondary | ICD-10-CM

## 2019-07-09 DIAGNOSIS — O3412 Maternal care for benign tumor of corpus uteri, second trimester: Secondary | ICD-10-CM

## 2019-07-09 LAB — POCT URINALYSIS DIPSTICK OB
Bilirubin, UA: NEGATIVE
Glucose, UA: NEGATIVE
Ketones, UA: NEGATIVE
Leukocytes, UA: NEGATIVE
Nitrite, UA: NEGATIVE
POC,PROTEIN,UA: NEGATIVE
Spec Grav, UA: 1.01 (ref 1.010–1.025)
Urobilinogen, UA: 0.2 E.U./dL
pH, UA: 6 (ref 5.0–8.0)

## 2019-07-09 NOTE — Patient Instructions (Signed)

## 2019-07-09 NOTE — Progress Notes (Signed)
ROB-Reports swelling while working and arm numbness upon waking in the morning. Discussed home treatment measures, handouts provided. Anticipatory guidance regarding course of prenatal care including growth ultrasound for uterine fibroids in pregnancy. Reviewed red flag symptoms and when to call. RTC Tuesday for growth ultrasound. RTC x 4-5 weeks for growth ultrasound, 28 week labs, TDaP, and ROB or sooner if needed.

## 2019-07-09 NOTE — Assessment & Plan Note (Signed)
Growth ultrasound every four (4) weeks for duration of pregnancy

## 2019-07-14 ENCOUNTER — Other Ambulatory Visit: Payer: Commercial Managed Care - PPO

## 2019-07-14 ENCOUNTER — Other Ambulatory Visit: Payer: Self-pay

## 2019-07-14 ENCOUNTER — Ambulatory Visit (INDEPENDENT_AMBULATORY_CARE_PROVIDER_SITE_OTHER): Payer: Commercial Managed Care - PPO

## 2019-07-14 ENCOUNTER — Telehealth: Payer: Self-pay

## 2019-07-14 DIAGNOSIS — Z3492 Encounter for supervision of normal pregnancy, unspecified, second trimester: Secondary | ICD-10-CM

## 2019-07-14 DIAGNOSIS — D259 Leiomyoma of uterus, unspecified: Secondary | ICD-10-CM

## 2019-07-14 DIAGNOSIS — O3412 Maternal care for benign tumor of corpus uteri, second trimester: Secondary | ICD-10-CM | POA: Diagnosis not present

## 2019-07-14 NOTE — Telephone Encounter (Signed)
LMTRC  Pt has a credit balance of 28.48. Need to know if pt would like a refund or leave the credit [place], The charges for 06/05/19 and 06/10/19 have now been processed.  [facility] applied $74.71 to the patient's coinsurance for [facility] 06/10/19.  I have applied her pre-payment to her self-pay balance, which leaves her a credit of $28.48.  I see she has some upcoming appointments.  Would she prefer to leave the credit balance on the account or she still wants the refund?

## 2019-07-14 NOTE — Telephone Encounter (Signed)
Pt aware I have sent Stacy an email asking her to process the refund.

## 2019-07-14 NOTE — Telephone Encounter (Signed)
Pt was checking out and I went over your message with her and she wants a refund of the 28.48. please advise the pt of her next process.

## 2019-08-07 ENCOUNTER — Telehealth: Payer: Self-pay | Admitting: Certified Nurse Midwife

## 2019-08-07 ENCOUNTER — Other Ambulatory Visit: Payer: Self-pay | Admitting: Certified Nurse Midwife

## 2019-08-07 NOTE — Telephone Encounter (Signed)
1218 Telephone call from patient, verified full name and date of birth.   Patient sitting at work, seated at desk. Started feeling nauseous and warm; felt faint.   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   Good fetal movement. No bleeding or contractions.   Patient had drank 48 oz of water intake today. No strenuous activity today.   Discussed normal pregnancy changes.   Reviewed red flag symptoms and when to call.    Diona Fanti, CNM [facility], [facility] 08/07/19 12:24 PM

## 2019-08-11 ENCOUNTER — Other Ambulatory Visit: Payer: Self-pay

## 2019-08-11 ENCOUNTER — Other Ambulatory Visit: Payer: Commercial Managed Care - PPO

## 2019-08-11 ENCOUNTER — Ambulatory Visit
Admission: RE | Admit: 2019-08-11 | Discharge: 2019-08-11 | Disposition: A | Discharge status: Home / Self Care | Payer: Commercial Managed Care - PPO | Source: Ambulatory Visit | Attending: Certified Nurse Midwife | Admitting: Certified Nurse Midwife

## 2019-08-11 ENCOUNTER — Ambulatory Visit (INDEPENDENT_AMBULATORY_CARE_PROVIDER_SITE_OTHER): Payer: Commercial Managed Care - PPO | Admitting: Certified Nurse Midwife

## 2019-08-11 DIAGNOSIS — Z3A28 28 weeks gestation of pregnancy: Secondary | ICD-10-CM | POA: Diagnosis not present

## 2019-08-11 DIAGNOSIS — Z3492 Encounter for supervision of normal pregnancy, unspecified, second trimester: Secondary | ICD-10-CM | POA: Diagnosis present

## 2019-08-11 LAB — POCT URINALYSIS DIPSTICK OB
Bilirubin, UA: NEGATIVE
Blood, UA: NEGATIVE
Glucose, UA: NEGATIVE
Ketones, UA: NEGATIVE
Leukocytes, UA: NEGATIVE
Nitrite, UA: NEGATIVE
POC,PROTEIN,UA: NEGATIVE
Spec Grav, UA: 1.02 (ref 1.010–1.025)
Urobilinogen, UA: 0.2 E.U./dL
pH, UA: 5 (ref 5.0–8.0)

## 2019-08-11 MED ORDER — TETANUS-DIPHTH-ACELL PERTUSSIS 5-2.5-18.5 LF-MCG/0.5 IM SUSP: 0.5000 mL | Freq: Once | INTRAMUSCULAR | Status: DC

## 2019-08-11 NOTE — Patient Instructions (Signed)
Glucose Tolerance Test During Pregnancy Why am I having this test? The glucose tolerance test (GTT) is done to check how your body processes sugar (glucose). This is one of several tests used to diagnose diabetes that develops during pregnancy (gestational diabetes mellitus). Gestational diabetes is a temporary form of diabetes that some women develop during pregnancy. It usually occurs during the second trimester of pregnancy and goes away after delivery. Testing (screening) for gestational diabetes usually occurs between 24 and 28 weeks of pregnancy. You may have the GTT test after having a 1-hour glucose screening test if the results from that test indicate that you may have gestational diabetes. You may also have this test if:  You have a history of gestational diabetes.  You have a history of giving birth to very large babies or have experienced repeated fetal loss (stillbirth).  You have signs and symptoms of diabetes, such as: ? Changes in your vision. ? Tingling or numbness in your hands or feet. ? Changes in hunger, thirst, and urination that are not otherwise explained by your pregnancy. What is being tested? This test measures the amount of glucose in your blood at different times during a period of 3 hours. This indicates how well your body is able to process glucose. What kind of sample is taken?  Blood samples are required for this test. They are usually collected by inserting a needle into a blood vessel. How do I prepare for this test?  For 3 days before your test, eat normally. Have plenty of carbohydrate-rich foods.  Follow instructions from your health care provider about: ? Eating or drinking restrictions on the day of the test. You may be asked to not eat or drink anything other than water (fast) starting 8-10 hours before the test. ? Changing or stopping your regular medicines. Some medicines may interfere with this test. Tell a health care provider about:  All  medicines you are taking, including vitamins, herbs, eye drops, creams, and over-the-counter medicines.  Any blood disorders you have.  Any surgeries you have had.  Any medical conditions you have. What happens during the test? First, your blood glucose will be measured. This is referred to as your fasting blood glucose, since you fasted before the test. Then, you will drink a glucose solution that contains a certain amount of glucose. Your blood glucose will be measured again 1, 2, and 3 hours after drinking the solution. This test takes about 3 hours to complete. You will need to stay at the testing location during this time. During the testing period:  Do not eat or drink anything other than the glucose solution.  Do not exercise.  Do not use any products that contain nicotine or tobacco, such as cigarettes and e-cigarettes. If you need help stopping, ask your health care provider. The testing procedure may vary among health care providers and hospitals. How are the results reported? Your results will be reported as milligrams of glucose per deciliter of blood (mg/dL) or millimoles per liter (mmol/L). Your health care provider will compare your results to normal ranges that were established after testing a large group of people (reference ranges). Reference ranges may vary among labs and hospitals. For this test, common reference ranges are:  Fasting: less than 95-105 mg/dL (5.3-5.8 mmol/L).  1 hour after drinking glucose: less than 180-190 mg/dL (10.0-10.5 mmol/L).  2 hours after drinking glucose: less than 155-165 mg/dL (8.6-9.2 mmol/L).  3 hours after drinking glucose: 140-145 mg/dL (7.8-8.1 mmol/L). What do the   results mean? Results within reference ranges are considered normal, meaning that your glucose levels are well-controlled. If two or more of your blood glucose levels are high, you may be diagnosed with gestational diabetes. If only one level is high, your health care  provider may suggest repeat testing or other tests to confirm a diagnosis. Talk with your health care provider about what your results mean. Questions to ask your health care provider Ask your health care provider, or the department that is doing the test:  When will my results be ready?  How will I get my results?  What are my treatment options?  What other tests do I need?  What are my next steps? Summary  The glucose tolerance test (GTT) is one of several tests used to diagnose diabetes that develops during pregnancy (gestational diabetes mellitus). Gestational diabetes is a temporary form of diabetes that some women develop during pregnancy.  You may have the GTT test after having a 1-hour glucose screening test if the results from that test indicate that you may have gestational diabetes. You may also have this test if you have any symptoms or risk factors for gestational diabetes.  Talk with your health care provider about what your results mean. This information is not intended to replace advice given to you by your health care provider. Make sure you discuss any questions you have with your health care provider. Document Revised: 07/10/2018 Document Reviewed: 10/29/2016 Elsevier Patient Education  2020 Elsevier Inc.  

## 2019-08-11 NOTE — Progress Notes (Signed)
ROB doing well. Feel good movement. U/s today done at hospital. Not released for me to see at this time. Will follow up once available. Discussed BTC/RPR/CBC/Tdap/Glucose screen today. Discussed birth control after baby, booklet given. Discussed birth plan, sample given. Will follow up with pt on birth plan/BC options next visit. ROB in 2 wk with Sharyn Lull.   Philip Aspen, CNM

## 2019-08-12 ENCOUNTER — Other Ambulatory Visit: Payer: Commercial Managed Care - PPO

## 2019-08-12 ENCOUNTER — Other Ambulatory Visit: Payer: Self-pay | Admitting: Certified Nurse Midwife

## 2019-08-12 ENCOUNTER — Encounter: Payer: Commercial Managed Care - PPO | Admitting: Certified Nurse Midwife

## 2019-08-12 DIAGNOSIS — R7309 Other abnormal glucose: Secondary | ICD-10-CM

## 2019-08-12 LAB — CBC
Hematocrit: 33.1 % — ABNORMAL LOW (ref 34.0–46.6)
Hemoglobin: 11.5 g/dL (ref 11.1–15.9)
MCH: 28.6 pg (ref 26.6–33.0)
MCHC: 34.7 g/dL (ref 31.5–35.7)
MCV: 82 fL (ref 79–97)
Platelets: 230 10*3/uL (ref 150–450)
RBC: 4.02 x10E6/uL (ref 3.77–5.28)
RDW: 14.5 % (ref 11.7–15.4)
WBC: 12.9 10*3/uL — ABNORMAL HIGH (ref 3.4–10.8)

## 2019-08-12 LAB — GLUCOSE, 1 HOUR GESTATIONAL: Gestational Diabetes Screen: 169 mg/dL — ABNORMAL HIGH (ref 65–139)

## 2019-08-12 LAB — RPR: RPR Ser Ql: NONREACTIVE

## 2019-08-12 NOTE — Progress Notes (Signed)
Abnormal 1 hr glucose . 3 hr ordered. Pt notified via my chart.

## 2019-08-24 ENCOUNTER — Other Ambulatory Visit: Payer: Commercial Managed Care - PPO

## 2019-08-24 ENCOUNTER — Encounter: Payer: Commercial Managed Care - PPO | Admitting: Certified Nurse Midwife

## 2019-08-28 ENCOUNTER — Ambulatory Visit (INDEPENDENT_AMBULATORY_CARE_PROVIDER_SITE_OTHER): Payer: Commercial Managed Care - PPO | Admitting: Certified Nurse Midwife

## 2019-08-28 ENCOUNTER — Encounter: Payer: Self-pay | Admitting: Certified Nurse Midwife

## 2019-08-28 ENCOUNTER — Other Ambulatory Visit: Payer: Self-pay

## 2019-08-28 ENCOUNTER — Encounter: Payer: Commercial Managed Care - PPO | Admitting: Certified Nurse Midwife

## 2019-08-28 ENCOUNTER — Other Ambulatory Visit: Payer: Commercial Managed Care - PPO

## 2019-08-28 VITALS — BP 114/87 | HR 107 | Wt 163.8 lb

## 2019-08-28 DIAGNOSIS — Z3403 Encounter for supervision of normal first pregnancy, third trimester: Secondary | ICD-10-CM

## 2019-08-28 DIAGNOSIS — D259 Leiomyoma of uterus, unspecified: Secondary | ICD-10-CM

## 2019-08-28 DIAGNOSIS — R7309 Other abnormal glucose: Secondary | ICD-10-CM

## 2019-08-28 DIAGNOSIS — O3412 Maternal care for benign tumor of corpus uteri, second trimester: Secondary | ICD-10-CM

## 2019-08-28 DIAGNOSIS — Z3A31 31 weeks gestation of pregnancy: Secondary | ICD-10-CM

## 2019-08-28 LAB — POCT URINALYSIS DIPSTICK OB
Bilirubin, UA: NEGATIVE
Blood, UA: NEGATIVE
Glucose, UA: NEGATIVE
Ketones, UA: NEGATIVE
Leukocytes, UA: NEGATIVE
Nitrite, UA: NEGATIVE
POC,PROTEIN,UA: NEGATIVE
Spec Grav, UA: 1.015 (ref 1.010–1.025)
Urobilinogen, UA: 0.2 U/dL
pH, UA: 6 (ref 5.0–8.0)

## 2019-08-28 NOTE — Progress Notes (Signed)
ROB-Pt present for 3 hours GT and routine prenatal care. Pt stated having swollen hand and feet. Pt stated wearing compression socks but it causes the swollen to increase.

## 2019-08-28 NOTE — Patient Instructions (Signed)
Fetal Movement Counts Patient Name: ________________________________________________ Patient Due Date: ____________________ What is a fetal movement count?  A fetal movement count is the number of times that you feel your baby move during a certain amount of time. This may also be called a fetal kick count. A fetal movement count is recommended for every pregnant woman. You may be asked to start counting fetal movements as early as week 28 of your pregnancy. Pay attention to when your baby is most active. You may notice your baby's sleep and wake cycles. You may also notice things that make your baby move more. You should do a fetal movement count:  When your baby is normally most active.  At the same time each day. A good time to count movements is while you are resting, after having something to eat and drink. How do I count fetal movements? 1. Find a quiet, comfortable area. Sit, or lie down on your side. 2. Write down the date, the start time and stop time, and the number of movements that you felt between those two times. Take this information with you to your health care visits. 3. Write down your start time when you feel the first movement. 4. Count kicks, flutters, swishes, rolls, and jabs. You should feel at least 10 movements. 5. You may stop counting after you have felt 10 movements, or if you have been counting for 2 hours. Write down the stop time. 6. If you do not feel 10 movements in 2 hours, contact your health care provider for further instructions. Your health care provider may want to do additional tests to assess your baby's well-being. Contact a health care provider if:  You feel fewer than 10 movements in 2 hours.  Your baby is not moving like he or she usually does. Date: ____________ Start time: ____________ Stop time: ____________ Movements: ____________ Date: ____________ Start time: ____________ Stop time: ____________ Movements: ____________ Date: ____________  Start time: ____________ Stop time: ____________ Movements: ____________ Date: ____________ Start time: ____________ Stop time: ____________ Movements: ____________ Date: ____________ Start time: ____________ Stop time: ____________ Movements: ____________ Date: ____________ Start time: ____________ Stop time: ____________ Movements: ____________ Date: ____________ Start time: ____________ Stop time: ____________ Movements: ____________ Date: ____________ Start time: ____________ Stop time: ____________ Movements: ____________ Date: ____________ Start time: ____________ Stop time: ____________ Movements: ____________ This information is not intended to replace advice given to you by your health care provider. Make sure you discuss any questions you have with your health care provider. Document Revised: 11/06/2018 Document Reviewed: 11/06/2018 Elsevier Patient Education  2020 Elsevier Inc.  

## 2019-08-28 NOTE — Progress Notes (Signed)
ROB- Doing well. Reports swelling to hands and feet and numbness in three fingers on bilateral hands. Tried compression stockings and elevation but reports made the swelling worse. Encouraged home treatment measures for management of carpal tunnel. Advised compression socks daily with increased water and protein intake. Baby Friendly education completed, see chart. Anticipatory guidance given. Reviewed red flags and when to call the office.  RTC x 1-2 weeks for growth ultrasound and ROB or sooner if needed.   Fransico Him RN [place] 08/28/19 10:04 AM

## 2019-08-28 NOTE — Progress Notes (Signed)
I have seen, interviewed, and examined the patient in conjunction with the [place] [facility] Nurse Practitioner student and affirm the diagnosis and management plan.   Diona Fanti, CNM [facility], [facility] 08/28/19 11:35 AM

## 2019-08-29 LAB — GESTATIONAL GLUCOSE TOLERANCE
Glucose, Fasting: 73 mg/dL (ref 65–94)
Glucose, GTT - 1 Hour: 174 mg/dL (ref 65–179)
Glucose, GTT - 2 Hour: 141 mg/dL (ref 65–154)
Glucose, GTT - 3 Hour: 100 mg/dL (ref 65–139)

## 2019-09-10 ENCOUNTER — Other Ambulatory Visit: Payer: Self-pay

## 2019-09-10 ENCOUNTER — Ambulatory Visit (INDEPENDENT_AMBULATORY_CARE_PROVIDER_SITE_OTHER): Payer: Commercial Managed Care - PPO

## 2019-09-10 ENCOUNTER — Ambulatory Visit (INDEPENDENT_AMBULATORY_CARE_PROVIDER_SITE_OTHER): Payer: Commercial Managed Care - PPO | Admitting: Certified Nurse Midwife

## 2019-09-10 VITALS — BP 131/85 | HR 114 | Wt 166.3 lb

## 2019-09-10 DIAGNOSIS — R7309 Other abnormal glucose: Secondary | ICD-10-CM | POA: Diagnosis not present

## 2019-09-10 DIAGNOSIS — Z3403 Encounter for supervision of normal first pregnancy, third trimester: Secondary | ICD-10-CM

## 2019-09-10 DIAGNOSIS — Z3A33 33 weeks gestation of pregnancy: Secondary | ICD-10-CM

## 2019-09-10 DIAGNOSIS — Z3A31 31 weeks gestation of pregnancy: Secondary | ICD-10-CM

## 2019-09-10 DIAGNOSIS — D259 Leiomyoma of uterus, unspecified: Secondary | ICD-10-CM

## 2019-09-10 DIAGNOSIS — O3413 Maternal care for benign tumor of corpus uteri, third trimester: Secondary | ICD-10-CM

## 2019-09-10 DIAGNOSIS — O3412 Maternal care for benign tumor of corpus uteri, second trimester: Secondary | ICD-10-CM | POA: Diagnosis not present

## 2019-09-10 LAB — POCT URINALYSIS DIPSTICK OB
Bilirubin, UA: NEGATIVE
Blood, UA: NEGATIVE
Ketones, UA: NEGATIVE
Leukocytes, UA: NEGATIVE
Nitrite, UA: NEGATIVE
POC,PROTEIN,UA: NEGATIVE
Spec Grav, UA: 1.015 (ref 1.010–1.025)
Urobilinogen, UA: 0.2 E.U./dL
pH, UA: 5 (ref 5.0–8.0)

## 2019-09-10 NOTE — Progress Notes (Signed)
ULTRASOUND REPORT  Location: [facility] Date of Service: 09/10/2019   Indications:growth/afi Findings:  Sharon Norman intrauterine pregnancy is visualized with FHR at 144 BPM. Biometrics give an (U/S) Gestational age of [redacted]w[redacted]d and an (U/S) EDD of 10/27/2019; this correlates with the clinically established Estimated Date of Delivery: 10/29/19.  Fetal presentation is Cephalic.  Placenta: anterior. Grade: 2 AFI: 16.4 cm  Growth percentile is 44. EFW: 2185 g ( 4 lbs 13 oz)   Impression: 1. [redacted]w[redacted]d Viable Singleton Intrauterine pregnancy previously established criteria. 2. Growth is 44 %ile.  AFI is 16.4 cm.   Recommendations: 1.Clinical correlation with the patient's History and Physical Exam.   I have seen, interviewed, and examined the patient in conjunction with the [place] [facility] Nurse Practitioner student and affirm the diagnosis and management plan.   Diona Fanti, CNM [facility], [facility] 09/10/19 5:28 PM

## 2019-09-10 NOTE — Progress Notes (Signed)
ROB- Doing well. Continues to report swelling to her ankles.Encouraged to increase water to 80-100 oz of water a day and increase protein. Growth scan done at today visit. Reviewed and all questions answered. Anticipatory guidance given. Reviewed red flags and when to call the office.  RTC x 2 weeks for ROB then RTC x 4 weeks for ROB/Ultrasound or sooner if needed.  Fransico Him RN [place] 09/10/19 5:08 PM

## 2019-09-10 NOTE — Patient Instructions (Signed)
Fetal Movement Counts Patient Name: ________________________________________________ Patient Due Date: ____________________ What is a fetal movement count?  A fetal movement count is the number of times that you feel your baby move during a certain amount of time. This may also be called a fetal kick count. A fetal movement count is recommended for every pregnant woman. You may be asked to start counting fetal movements as early as week 28 of your pregnancy. Pay attention to when your baby is most active. You may notice your baby's sleep and wake cycles. You may also notice things that make your baby move more. You should do a fetal movement count:  When your baby is normally most active.  At the same time each day. A good time to count movements is while you are resting, after having something to eat and drink. How do I count fetal movements? 1. Find a quiet, comfortable area. Sit, or lie down on your side. 2. Write down the date, the start time and stop time, and the number of movements that you felt between those two times. Take this information with you to your health care visits. 3. Write down your start time when you feel the first movement. 4. Count kicks, flutters, swishes, rolls, and jabs. You should feel at least 10 movements. 5. You may stop counting after you have felt 10 movements, or if you have been counting for 2 hours. Write down the stop time. 6. If you do not feel 10 movements in 2 hours, contact your health care provider for further instructions. Your health care provider may want to do additional tests to assess your baby's well-being. Contact a health care provider if:  You feel fewer than 10 movements in 2 hours.  Your baby is not moving like he or she usually does. Date: ____________ Start time: ____________ Stop time: ____________ Movements: ____________ Date: ____________ Start time: ____________ Stop time: ____________ Movements: ____________ Date: ____________  Start time: ____________ Stop time: ____________ Movements: ____________ Date: ____________ Start time: ____________ Stop time: ____________ Movements: ____________ Date: ____________ Start time: ____________ Stop time: ____________ Movements: ____________ Date: ____________ Start time: ____________ Stop time: ____________ Movements: ____________ Date: ____________ Start time: ____________ Stop time: ____________ Movements: ____________ Date: ____________ Start time: ____________ Stop time: ____________ Movements: ____________ Date: ____________ Start time: ____________ Stop time: ____________ Movements: ____________ This information is not intended to replace advice given to you by your health care provider. Make sure you discuss any questions you have with your health care provider. Document Revised: 11/06/2018 Document Reviewed: 11/06/2018 Elsevier Patient Education  2020 Elsevier Inc.  

## 2019-09-23 ENCOUNTER — Ambulatory Visit (INDEPENDENT_AMBULATORY_CARE_PROVIDER_SITE_OTHER): Payer: Commercial Managed Care - PPO | Admitting: Certified Nurse Midwife

## 2019-09-23 ENCOUNTER — Other Ambulatory Visit: Payer: Self-pay

## 2019-09-23 ENCOUNTER — Encounter: Payer: Self-pay | Admitting: Certified Nurse Midwife

## 2019-09-23 VITALS — BP 110/79 | HR 99 | Wt 166.2 lb

## 2019-09-23 DIAGNOSIS — Z3A34 34 weeks gestation of pregnancy: Secondary | ICD-10-CM

## 2019-09-23 LAB — POCT URINALYSIS DIPSTICK OB
Bilirubin, UA: NEGATIVE
Blood, UA: NEGATIVE
Glucose, UA: NEGATIVE
Ketones, UA: NEGATIVE
Leukocytes, UA: NEGATIVE
Nitrite, UA: NEGATIVE
POC,PROTEIN,UA: NEGATIVE
Spec Grav, UA: 1.01 (ref 1.010–1.025)
Urobilinogen, UA: 0.2 E.U./dL
pH, UA: 5 (ref 5.0–8.0)

## 2019-09-23 NOTE — Progress Notes (Signed)
ROB doing well feels good movement. Discussed GBS testing next visit. Reviewed labor precautions. Discussed RSB-see check list for topics covered. pT state she has also completed BF class. Follow up 2 wks with Sharyn Lull.   Philip Aspen, CNM

## 2019-09-23 NOTE — Patient Instructions (Signed)
Group B Streptococcus Infection During Pregnancy °Group B Streptococcus (GBS) is a type of bacteria that is often found in healthy people. It is commonly found in the rectum, vagina, and intestines. In people who are healthy and not pregnant, the bacteria rarely cause serious illness or complications. However, women who test positive for GBS during pregnancy can pass the bacteria to the baby during childbirth. This can cause serious infection in the baby after birth. °Women with GBS may also have infections during their pregnancy or soon after childbirth. The infections include urinary tract infections (UTIs) or infections of the uterus. GBS also increases a woman's risk of complications during pregnancy, such as early labor or delivery, miscarriage, or stillbirth. Routine testing for GBS is recommended for all pregnant women. °What are the causes? °This condition is caused by bacteria called Streptococcus agalactiae. °What increases the risk? °You may have a higher risk for GBS infection during pregnancy if you had one during a past pregnancy. °What are the signs or symptoms? °In most cases, GBS infection does not cause symptoms in pregnant women. If symptoms exist, they may include: °· Labor that starts before the 37th week of pregnancy. °· A UTI or bladder infection. This may cause a fever, frequent urination, or pain and burning during urination. °· Fever during labor. There can also be a rapid heartbeat in the mother or baby. °Rare but serious symptoms of a GBS infection in women include: °· Blood infection (septicemia). This may cause fever, chills, or confusion. °· Lung infection (pneumonia). This may cause fever, chills, cough, rapid breathing, chest pain, or difficulty breathing. °· Bone, joint, skin, or soft tissue infection. °How is this diagnosed? °You may be screened for GBS between week 35 and week 37 of pregnancy. If you have symptoms of preterm labor, you may be screened earlier. This condition is  diagnosed based on lab test results from: °· A swab of fluid from the vagina and rectum. °· A urine sample. °How is this treated? °This condition is treated with antibiotic medicine. Antibiotic medicine may be given: °· To you when you go into labor, or as soon as your water breaks. The medicines will continue until after you give birth. If you are having a cesarean delivery, you do not need antibiotics unless your water has broken. °· To your baby, if he or she requires treatment. Your health care provider will check your baby to decide if he or she needs antibiotics to prevent a serious infection. °Follow these instructions at home: °· Take over-the-counter and prescription medicines only as told by your health care provider. °· Take your antibiotic medicine as told by your health care provider. Do not stop taking the antibiotic even if you start to feel better. °· Keep all pre-birth (prenatal) visits and follow-up visits as told by your health care provider. This is important. °Contact a health care provider if: °· You have pain or burning when you urinate. °· You have to urinate more often than usual. °· You have a fever or chills. °· You develop a bad-smelling vaginal discharge. °Get help right away if: °· Your water breaks. °· You go into labor. °· You have severe pain in your abdomen. °· You have difficulty breathing. °· You have chest pain. °These symptoms may represent a serious problem that is an emergency. Do not wait to see if the symptoms will go away. Get medical help right away. Call your local emergency services (911 in the [place] Do not drive yourself to   the hospital. °Summary °· GBS is a type of bacteria that is common in healthy people. °· During pregnancy, colonization with GBS can cause serious complications for you or your baby. °· Your health care provider will screen you between 35 and 37 weeks of pregnancy to determine if you are colonized with GBS. °· If you are colonized with GBS during  pregnancy, your health care provider will recommend antibiotics through an IV during labor. °· After delivery, your baby will be evaluated for complications related to potential GBS infection and may require antibiotics to prevent a serious infection. °This information is not intended to replace advice given to you by your health care provider. Make sure you discuss any questions you have with your health care provider. °Document Revised: 10/13/2018 Document Reviewed: 10/13/2018 °Elsevier Patient Education © 2020 Elsevier Inc. ° °

## 2019-10-08 ENCOUNTER — Other Ambulatory Visit: Payer: Self-pay | Admitting: Certified Nurse Midwife

## 2019-10-08 ENCOUNTER — Telehealth: Payer: Self-pay | Admitting: Certified Nurse Midwife

## 2019-10-08 ENCOUNTER — Ambulatory Visit (INDEPENDENT_AMBULATORY_CARE_PROVIDER_SITE_OTHER): Payer: Commercial Managed Care - PPO | Admitting: Certified Nurse Midwife

## 2019-10-08 ENCOUNTER — Other Ambulatory Visit (INDEPENDENT_AMBULATORY_CARE_PROVIDER_SITE_OTHER): Payer: Medicaid Other

## 2019-10-08 ENCOUNTER — Other Ambulatory Visit: Payer: Self-pay

## 2019-10-08 VITALS — BP 113/86 | HR 101 | Wt 171.4 lb

## 2019-10-08 DIAGNOSIS — Z3403 Encounter for supervision of normal first pregnancy, third trimester: Secondary | ICD-10-CM

## 2019-10-08 DIAGNOSIS — D259 Leiomyoma of uterus, unspecified: Secondary | ICD-10-CM

## 2019-10-08 DIAGNOSIS — Z3A37 37 weeks gestation of pregnancy: Secondary | ICD-10-CM | POA: Diagnosis not present

## 2019-10-08 DIAGNOSIS — O3413 Maternal care for benign tumor of corpus uteri, third trimester: Secondary | ICD-10-CM

## 2019-10-08 DIAGNOSIS — Z09 Encounter for follow-up examination after completed treatment for conditions other than malignant neoplasm: Secondary | ICD-10-CM | POA: Diagnosis not present

## 2019-10-08 DIAGNOSIS — Z113 Encounter for screening for infections with a predominantly sexual mode of transmission: Secondary | ICD-10-CM

## 2019-10-08 DIAGNOSIS — Z3685 Encounter for antenatal screening for Streptococcus B: Secondary | ICD-10-CM

## 2019-10-08 LAB — POCT URINALYSIS DIPSTICK OB
Bilirubin, UA: NEGATIVE
Blood, UA: NEGATIVE
Glucose, UA: NEGATIVE
Ketones, UA: NEGATIVE
Leukocytes, UA: NEGATIVE
Nitrite, UA: NEGATIVE
POC,PROTEIN,UA: NEGATIVE
Spec Grav, UA: 1.025 (ref 1.010–1.025)
Urobilinogen, UA: 0.2 E.U./dL
pH, UA: 5 (ref 5.0–8.0)

## 2019-10-08 LAB — OB RESULTS CONSOLE GC/CHLAMYDIA: Gonorrhea: NEGATIVE

## 2019-10-08 NOTE — Telephone Encounter (Signed)
Error

## 2019-10-08 NOTE — Patient Instructions (Addendum)
Fetal Movement Counts Patient Name: ________________________________________________ Patient Due Date: ____________________ What is a fetal movement count?  A fetal movement count is the number of times that you feel your baby move during a certain amount of time. This may also be called a fetal kick count. A fetal movement count is recommended for every pregnant woman. You may be asked to start counting fetal movements as early as week 28 of your pregnancy. Pay attention to when your baby is most active. You may notice your baby's sleep and wake cycles. You may also notice things that make your baby move more. You should do a fetal movement count:  When your baby is normally most active.  At the same time each day. A good time to count movements is while you are resting, after having something to eat and drink. How do I count fetal movements? 1. Find a quiet, comfortable area. Sit, or lie down on your side. 2. Write down the date, the start time and stop time, and the number of movements that you felt between those two times. Take this information with you to your health care visits. 3. Write down your start time when you feel the first movement. 4. Count kicks, flutters, swishes, rolls, and jabs. You should feel at least 10 movements. 5. You may stop counting after you have felt 10 movements, or if you have been counting for 2 hours. Write down the stop time. 6. If you do not feel 10 movements in 2 hours, contact your health care provider for further instructions. Your health care provider may want to do additional tests to assess your baby's well-being. Contact a health care provider if:  You feel fewer than 10 movements in 2 hours.  Your baby is not moving like he or she usually does. Date: ____________ Start time: ____________ Stop time: ____________ Movements: ____________ Date: ____________ Start time: ____________ Stop time: ____________ Movements: ____________ Date: ____________  Start time: ____________ Stop time: ____________ Movements: ____________ Date: ____________ Start time: ____________ Stop time: ____________ Movements: ____________ Date: ____________ Start time: ____________ Stop time: ____________ Movements: ____________ Date: ____________ Start time: ____________ Stop time: ____________ Movements: ____________ Date: ____________ Start time: ____________ Stop time: ____________ Movements: ____________ Date: ____________ Start time: ____________ Stop time: ____________ Movements: ____________ Date: ____________ Start time: ____________ Stop time: ____________ Movements: ____________ This information is not intended to replace advice given to you by your health care provider. Make sure you discuss any questions you have with your health care provider. Document Revised: 11/06/2018 Document Reviewed: 11/06/2018 Elsevier Patient Education  [place].  Nonstress Test A nonstress test is a procedure that is done during pregnancy in order to check the baby's heartbeat. The procedure can help show if the baby (fetus) is healthy. It is commonly done when:  The baby is past his or her due date.  The pregnancy is high risk.  The baby is moving less than normal.  The mother has lost a pregnancy in the past.  The health care provider suspects a problem with the baby's growth.  There is too much or too little amniotic fluid. The procedure is often done in the third trimester of pregnancy to find out if an early delivery is needed and whether such a delivery is safe. During a nonstress test, the baby's heartbeat is monitored when the baby is resting and when the baby is moving. If the baby is healthy, the heart rate will increase when he or she moves or kicks and  will return to normal when he or she rests. Tell a health care provider about:  Any allergies you have.  Any medical conditions you have.  All medicines you are taking, including vitamins, herbs,  eye drops, creams, and over-the-counter medicines. What are the risks? There are no risks to you or your baby from a nonstress test. This procedure should not be painful or uncomfortable. What happens before the procedure?  Eat a meal right before the test or as directed by your health care provider. Food may help encourage the baby to move.  Use the restroom right before the test. What happens during the procedure?  Two monitors will be placed on your abdomen. One will record the baby's heart rate and the other will record the contractions of your uterus.  You may be asked to lie down on your side or to sit upright.  You may be given a button to press when you feel your baby move.  Your health care provider will listen to your baby's heartbeat and recorded it. He or she may also watch your baby's heartbeat on a screen.  If the baby seems to be sleeping, you may be asked to drink some juice or soda, eat a snack, or change positions. The procedure may vary among health care providers and hospitals. What happens after the procedure?  Your health care provider will discuss the test results with you and make recommendations for the future. Depending on the results, your health care provider may order additional tests or another course of action.  If your health care provider gave you any diet or activity instructions, make sure to follow them.  Keep all follow-up visits as told by your health care provider. This is important. Summary  A nonstress test is a procedure that is done during pregnancy in order to check the baby's heartbeat. The procedure can help show if the baby is healthy.  The procedure is often done in the third trimester of pregnancy to find out if an early delivery is needed and whether such a delivery is safe.  During a nonstress test, the baby's heartbeat is monitored when the baby is resting and when the baby is moving. If the baby is healthy, the heart rate will  increase when he or she moves or kicks and will return to normal when he or she rests.  Your health care provider will discuss the test results with you and make recommendations for the future. This information is not intended to replace advice given to you by your health care provider. Make sure you discuss any questions you have with your health care provider. Document Revised: 06/28/2016 Document Reviewed: 06/28/2016 Elsevier Patient Education  [place].

## 2019-10-08 NOTE — Progress Notes (Signed)
ROB-Reports increased pelvic pressure. Discussed home treatment measures. Growth ultrasound and AFI today normal except for Grade 3 placenta. Ultrasound reviewed with patient and Dr. Amalia Hailey, will consider IOL at due date if no signs of labor. 36 week cultures collected, see orders. Herbal prep guide, birth affirmations and pre-labor checklist given. Anticipatory guidance regarding course of prenatal care. Reviewed red flag symptoms and when to call. RTC x 1 week for ROB or sooner if needed.   ULTRASOUND REPORT  Location: [facility] Date of Service: 10/08/2019   Indications:growth/afi Findings:  Sharon Norman intrauterine pregnancy is visualized with FHR at 137 BPM. Biometrics give an (U/S) Gestational age of [redacted]w[redacted]d and an (U/S) EDD of 10/30/2019; this correlates with the clinically established Estimated Date of Delivery: 10/29/19.  Fetal presentation is Cephalic.  Placenta: anterior. Grade: 3 AFI: 15.6 cm  Growth percentile is 51. EFW: 3125 g ( 6 lbs 14 oz)   Impression: 1. [redacted]w[redacted]d Viable Singleton Intrauterine pregnancy previously established criteria. 2. Growth is 51 %ile.  AFI is 15.6 cm.   Recommendations: 1.Clinical correlation with the patient's History and Physical Exam.

## 2019-10-10 LAB — GC/CHLAMYDIA PROBE AMP
Chlamydia trachomatis, NAA: NEGATIVE
Neisseria Gonorrhoeae by PCR: NEGATIVE

## 2019-10-10 LAB — STREP GP B NAA: Strep Gp B NAA: NEGATIVE

## 2019-10-13 ENCOUNTER — Encounter: Payer: Self-pay | Admitting: Certified Nurse Midwife

## 2019-10-13 ENCOUNTER — Ambulatory Visit (INDEPENDENT_AMBULATORY_CARE_PROVIDER_SITE_OTHER): Payer: Commercial Managed Care - PPO | Admitting: Certified Nurse Midwife

## 2019-10-13 ENCOUNTER — Other Ambulatory Visit: Payer: Self-pay

## 2019-10-13 VITALS — BP 131/92 | HR 100 | Wt 170.7 lb

## 2019-10-13 DIAGNOSIS — O163 Unspecified maternal hypertension, third trimester: Secondary | ICD-10-CM

## 2019-10-13 DIAGNOSIS — Z3A37 37 weeks gestation of pregnancy: Secondary | ICD-10-CM

## 2019-10-13 LAB — POCT URINALYSIS DIPSTICK OB
Bilirubin, UA: NEGATIVE
Glucose, UA: NEGATIVE
Ketones, UA: NEGATIVE
Leukocytes, UA: NEGATIVE
Nitrite, UA: NEGATIVE
Odor: NEGATIVE
POC,PROTEIN,UA: NEGATIVE
Spec Grav, UA: 1.01 (ref 1.010–1.025)
Urobilinogen, UA: 0.2 E.U./dL
pH, UA: 6 (ref 5.0–8.0)

## 2019-10-13 NOTE — Progress Notes (Signed)
ROB doing well. Feels good movements. Discussed kick counts. 2 x daily. Discussed NST next visit. BP elelvated today. Repeat 125/92  She denies headache, visual changes , epigastic pain . She has some mild swelling in her hands and feet. Labs collected today. Will follow up with results. Pre E precautions reviewed.   Philip Aspen, CNM

## 2019-10-13 NOTE — Patient Instructions (Signed)
Nonstress Test A nonstress test is a procedure that is done during pregnancy in order to check the baby's heartbeat. The procedure can help show if the baby (fetus) is healthy. It is commonly done when:  The baby is past his or her due date.  The pregnancy is high risk.  The baby is moving less than normal.  The mother has lost a pregnancy in the past.  The health care provider suspects a problem with the baby's growth.  There is too much or too little amniotic fluid. The procedure is often done in the third trimester of pregnancy to find out if an early delivery is needed and whether such a delivery is safe. During a nonstress test, the baby's heartbeat is monitored when the baby is resting and when the baby is moving. If the baby is healthy, the heart rate will increase when he or she moves or kicks and will return to normal when he or she rests. Tell a health care provider about:  Any allergies you have.  Any medical conditions you have.  All medicines you are taking, including vitamins, herbs, eye drops, creams, and over-the-counter medicines. What are the risks? There are no risks to you or your baby from a nonstress test. This procedure should not be painful or uncomfortable. What happens before the procedure?  Eat a meal right before the test or as directed by your health care provider. Food may help encourage the baby to move.  Use the restroom right before the test. What happens during the procedure?  Two monitors will be placed on your abdomen. One will record the baby's heart rate and the other will record the contractions of your uterus.  You may be asked to lie down on your side or to sit upright.  You may be given a button to press when you feel your baby move.  Your health care provider will listen to your baby's heartbeat and recorded it. He or she may also watch your baby's heartbeat on a screen.  If the baby seems to be sleeping, you may be asked to drink  some juice or soda, eat a snack, or change positions. The procedure may vary among health care providers and hospitals. What happens after the procedure?  Your health care provider will discuss the test results with you and make recommendations for the future. Depending on the results, your health care provider may order additional tests or another course of action.  If your health care provider gave you any diet or activity instructions, make sure to follow them.  Keep all follow-up visits as told by your health care provider. This is important. Summary  A nonstress test is a procedure that is done during pregnancy in order to check the baby's heartbeat. The procedure can help show if the baby is healthy.  The procedure is often done in the third trimester of pregnancy to find out if an early delivery is needed and whether such a delivery is safe.  During a nonstress test, the baby's heartbeat is monitored when the baby is resting and when the baby is moving. If the baby is healthy, the heart rate will increase when he or she moves or kicks and will return to normal when he or she rests.  Your health care provider will discuss the test results with you and make recommendations for the future. This information is not intended to replace advice given to you by your health care provider. Make sure you discuss any  questions you have with your health care provider. Document Revised: 06/28/2016 Document Reviewed: 06/28/2016 Elsevier Patient Education  2020 [employer]  

## 2019-10-13 NOTE — Progress Notes (Signed)
ROB- Wants to be taken out of work by next week.

## 2019-10-13 NOTE — Progress Notes (Signed)
Repeat pressure- 125/92 100

## 2019-10-14 LAB — COMPREHENSIVE METABOLIC PANEL
ALT: 10 IU/L (ref 0–32)
AST: 14 IU/L (ref 0–40)
Albumin/Globulin Ratio: 1.7 (ref 1.2–2.2)
Albumin: 3.8 g/dL (ref 3.8–4.8)
Alkaline Phosphatase: 176 IU/L — ABNORMAL HIGH (ref 48–121)
BUN/Creatinine Ratio: 9 (ref 9–23)
BUN: 5 mg/dL — ABNORMAL LOW (ref 6–20)
Bilirubin Total: 0.3 mg/dL (ref 0.0–1.2)
CO2: 22 mmol/L (ref 20–29)
Calcium: 9.1 mg/dL (ref 8.7–10.2)
Chloride: 104 mmol/L (ref 96–106)
Creatinine, Ser: 0.56 mg/dL — ABNORMAL LOW (ref 0.57–1.00)
GFR calc Af Amer: 138 mL/min/{1.73_m2} (ref >59)
GFR calc non Af Amer: 119 mL/min/{1.73_m2} (ref >59)
Globulin, Total: 2.2 g/dL (ref 1.5–4.5)
Glucose: 97 mg/dL (ref 65–99)
Potassium: 3.7 mmol/L (ref 3.5–5.2)
Sodium: 137 mmol/L (ref 134–144)
Total Protein: 6 g/dL (ref 6.0–8.5)

## 2019-10-14 LAB — CBC WITH DIFFERENTIAL/PLATELET
Basophils Absolute: 0.1 10*3/uL (ref 0.0–0.2)
Basos: 1 %
EOS (ABSOLUTE): 0.1 10*3/uL (ref 0.0–0.4)
Eos: 1 %
Hematocrit: 36.7 % (ref 34.0–46.6)
Hemoglobin: 12.2 g/dL (ref 11.1–15.9)
Immature Grans (Abs): 0.4 10*3/uL — ABNORMAL HIGH (ref 0.0–0.1)
Immature Granulocytes: 4 %
Lymphocytes Absolute: 1.5 10*3/uL (ref 0.7–3.1)
Lymphs: 15 %
MCH: 27.8 pg (ref 26.6–33.0)
MCHC: 33.2 g/dL (ref 31.5–35.7)
MCV: 84 fL (ref 79–97)
Monocytes Absolute: 0.7 10*3/uL (ref 0.1–0.9)
Monocytes: 7 %
Neutrophils Absolute: 7.6 10*3/uL — ABNORMAL HIGH (ref 1.4–7.0)
Neutrophils: 72 %
Platelets: 220 10*3/uL (ref 150–450)
RBC: 4.39 x10E6/uL (ref 3.77–5.28)
RDW: 15 % (ref 11.7–15.4)
WBC: 10.5 10*3/uL (ref 3.4–10.8)

## 2019-10-14 LAB — PROTEIN / CREATININE RATIO, URINE
Creatinine, Urine: 52.2 mg/dL
Protein, Ur: 11.2 mg/dL
Protein/Creat Ratio: 215 mg/g creat — ABNORMAL HIGH (ref 0–200)

## 2019-10-23 ENCOUNTER — Other Ambulatory Visit: Payer: Self-pay

## 2019-10-23 ENCOUNTER — Ambulatory Visit (INDEPENDENT_AMBULATORY_CARE_PROVIDER_SITE_OTHER): Payer: Commercial Managed Care - PPO | Admitting: Certified Nurse Midwife

## 2019-10-23 VITALS — BP 126/80 | HR 98 | Wt 173.4 lb

## 2019-10-23 DIAGNOSIS — O3413 Maternal care for benign tumor of corpus uteri, third trimester: Secondary | ICD-10-CM

## 2019-10-23 DIAGNOSIS — Z3A39 39 weeks gestation of pregnancy: Secondary | ICD-10-CM

## 2019-10-23 DIAGNOSIS — Z3403 Encounter for supervision of normal first pregnancy, third trimester: Secondary | ICD-10-CM

## 2019-10-23 DIAGNOSIS — D259 Leiomyoma of uterus, unspecified: Secondary | ICD-10-CM | POA: Diagnosis not present

## 2019-10-23 LAB — POCT URINALYSIS DIPSTICK OB
Bilirubin, UA: NEGATIVE
Blood, UA: NEGATIVE
Glucose, UA: NEGATIVE
Ketones, UA: NEGATIVE
Leukocytes, UA: NEGATIVE
Nitrite, UA: NEGATIVE
POC,PROTEIN,UA: NEGATIVE
Spec Grav, UA: 1.02 (ref 1.010–1.025)
Urobilinogen, UA: 0.2 E.U./dL
pH, UA: 5 (ref 5.0–8.0)

## 2019-10-23 NOTE — Progress Notes (Signed)
ROB-Doing well, no questions or concerns. NST reactive today with baseline fetal heart rate of 125, accelerations present and no decelerations noted; occasional contractions. Anticipatory guidance regarding course of prenatal care. Reviewed red flag symptoms and when to call. RTC x 1 week for AFI, NST, and ROB or sooner if needed.

## 2019-10-23 NOTE — Patient Instructions (Signed)
Nonstress Test A nonstress test is a procedure that is done during pregnancy in order to check the baby's heartbeat. The procedure can help show if the baby (fetus) is healthy. It is commonly done when:  The baby is past his or her due date.  The pregnancy is high risk.  The baby is moving less than normal.  The mother has lost a pregnancy in the past.  The health care provider suspects a problem with the baby's growth.  There is too much or too little amniotic fluid. The procedure is often done in the third trimester of pregnancy to find out if an early delivery is needed and whether such a delivery is safe. During a nonstress test, the baby's heartbeat is monitored when the baby is resting and when the baby is moving. If the baby is healthy, the heart rate will increase when he or she moves or kicks and will return to normal when he or she rests. Tell a health care provider about:  Any allergies you have.  Any medical conditions you have.  All medicines you are taking, including vitamins, herbs, eye drops, creams, and over-the-counter medicines. What are the risks? There are no risks to you or your baby from a nonstress test. This procedure should not be painful or uncomfortable. What happens before the procedure?  Eat a meal right before the test or as directed by your health care provider. Food may help encourage the baby to move.  Use the restroom right before the test. What happens during the procedure?  Two monitors will be placed on your abdomen. One will record the baby's heart rate and the other will record the contractions of your uterus.  You may be asked to lie down on your side or to sit upright.  You may be given a button to press when you feel your baby move.  Your health care provider will listen to your baby's heartbeat and recorded it. He or she may also watch your baby's heartbeat on a screen.  If the baby seems to be sleeping, you may be asked to drink  some juice or soda, eat a snack, or change positions. The procedure may vary among health care providers and hospitals. What happens after the procedure?  Your health care provider will discuss the test results with you and make recommendations for the future. Depending on the results, your health care provider may order additional tests or another course of action.  If your health care provider gave you any diet or activity instructions, make sure to follow them.  Keep all follow-up visits as told by your health care provider. This is important. Summary  A nonstress test is a procedure that is done during pregnancy in order to check the baby's heartbeat. The procedure can help show if the baby is healthy.  The procedure is often done in the third trimester of pregnancy to find out if an early delivery is needed and whether such a delivery is safe.  During a nonstress test, the baby's heartbeat is monitored when the baby is resting and when the baby is moving. If the baby is healthy, the heart rate will increase when he or she moves or kicks and will return to normal when he or she rests.  Your health care provider will discuss the test results with you and make recommendations for the future. This information is not intended to replace advice given to you by your health care provider. Make sure you discuss any  questions you have with your health care provider. °Document Revised: 06/28/2016 Document Reviewed: 06/28/2016 °Elsevier Patient Education © 2020 [employer] ° ° ° °Fetal Movement Counts °Patient Name: ________________________________________________ Patient Due Date: ____________________ °What is a fetal movement count? ° °A fetal movement count is the number of times that you feel your baby move during a certain amount of time. This may also be called a fetal kick count. A fetal movement count is recommended for every pregnant woman. You may be asked to start counting fetal movements as  early as week 28 of your pregnancy. °Pay attention to when your baby is most active. You may notice your baby's sleep and wake cycles. You may also notice things that make your baby move more. You should do a fetal movement count: °· When your baby is normally most active. °· At the same time each day. °A good time to count movements is while you are resting, after having something to eat and drink. °How do I count fetal movements? °1. Find a quiet, comfortable area. Sit, or lie down on your side. °2. Write down the date, the start time and stop time, and the number of movements that you felt between those two times. Take this information with you to your health care visits. °3. Write down your start time when you feel the first movement. °4. Count kicks, flutters, swishes, rolls, and jabs. You should feel at least 10 movements. °5. You may stop counting after you have felt 10 movements, or if you have been counting for 2 hours. Write down the stop time. °6. If you do not feel 10 movements in 2 hours, contact your health care provider for further instructions. Your health care provider may want to do additional tests to assess your baby's well-being. °Contact a health care provider if: °· You feel fewer than 10 movements in 2 hours. °· Your baby is not moving like he or she usually does. °Date: ____________ Start time: ____________ Stop time: ____________ Movements: ____________ °Date: ____________ Start time: ____________ Stop time: ____________ Movements: ____________ °Date: ____________ Start time: ____________ Stop time: ____________ Movements: ____________ °Date: ____________ Start time: ____________ Stop time: ____________ Movements: ____________ °Date: ____________ Start time: ____________ Stop time: ____________ Movements: ____________ °Date: ____________ Start time: ____________ Stop time: ____________ Movements: ____________ °Date: ____________ Start time: ____________ Stop time: ____________ Movements:  ____________ °Date: ____________ Start time: ____________ Stop time: ____________ Movements: ____________ °Date: ____________ Start time: ____________ Stop time: ____________ Movements: ____________ °This information is not intended to replace advice given to you by your health care provider. Make sure you discuss any questions you have with your health care provider. °Document Revised: 11/06/2018 Document Reviewed: 11/06/2018 °Elsevier Patient Education © 2020 [employer] ° °

## 2019-10-28 ENCOUNTER — Other Ambulatory Visit: Payer: Self-pay | Admitting: Certified Nurse Midwife

## 2019-10-28 DIAGNOSIS — O480 Post-term pregnancy: Secondary | ICD-10-CM

## 2019-10-29 ENCOUNTER — Other Ambulatory Visit: Payer: Commercial Managed Care - PPO

## 2019-10-29 ENCOUNTER — Ambulatory Visit (INDEPENDENT_AMBULATORY_CARE_PROVIDER_SITE_OTHER): Payer: Commercial Managed Care - PPO

## 2019-10-29 ENCOUNTER — Ambulatory Visit (INDEPENDENT_AMBULATORY_CARE_PROVIDER_SITE_OTHER): Payer: Commercial Managed Care - PPO | Admitting: Certified Nurse Midwife

## 2019-10-29 VITALS — BP 144/82 | HR 91 | Wt 175.2 lb

## 2019-10-29 DIAGNOSIS — O3412 Maternal care for benign tumor of corpus uteri, second trimester: Secondary | ICD-10-CM | POA: Diagnosis not present

## 2019-10-29 DIAGNOSIS — Z3A40 40 weeks gestation of pregnancy: Secondary | ICD-10-CM | POA: Diagnosis not present

## 2019-10-29 DIAGNOSIS — O480 Post-term pregnancy: Secondary | ICD-10-CM | POA: Diagnosis not present

## 2019-10-29 DIAGNOSIS — Z3403 Encounter for supervision of normal first pregnancy, third trimester: Secondary | ICD-10-CM

## 2019-10-29 DIAGNOSIS — D259 Leiomyoma of uterus, unspecified: Secondary | ICD-10-CM | POA: Diagnosis not present

## 2019-10-29 LAB — POCT URINALYSIS DIPSTICK OB
Bilirubin, UA: NEGATIVE
Blood, UA: NEGATIVE
Glucose, UA: NEGATIVE
Ketones, UA: NEGATIVE
Leukocytes, UA: NEGATIVE
Nitrite, UA: NEGATIVE
POC,PROTEIN,UA: NEGATIVE
Spec Grav, UA: <=1.005 — AB (ref 1.010–1.025)
Urobilinogen, UA: 0.2 E.U./dL
pH, UA: 6 (ref 5.0–8.0)

## 2019-10-29 NOTE — Progress Notes (Signed)
ROB-Doing well. NST performed today was reviewed and was found to be reactive.  Baseline 120 bpm with moderate variability, no decels noted. AFI normal at 12.5 cm. IOL scheduled for Saturday, July 31 at 0800. Patient and spouse will consider outpatient cervical ripening options and contact CNM in morning if desired. Reviewed red flag symptoms and when to call.  ULTRASOUND REPORT  Location: [facility] Date of Service: 10/29/2019   Indications:growth/afi Findings:  Sharon Norman intrauterine pregnancy is visualized with FHR at 152 BPM. Biometrics give an (U/S) Gestational age of [redacted]w[redacted]d and an (U/S) EDD of 11/01/2019; this correlates with the clinically established Estimated Date of Delivery: 10/29/19.  Fetal presentation is Cephalic.  Placenta: anterior. Grade: 2 AFI: 12.5 cm  Growth percentile is 73. EFW: 3826 g ( 8 lbs 7 oz)   Impression: 1. [redacted]w[redacted]d Viable Singleton Intrauterine pregnancy previously established criteria. 2. Growth is 73 %ile.  AFI is 12.5 cm.   Recommendations: 1.Clinical correlation with the patient's History and Physical Exam.

## 2019-10-29 NOTE — Addendum Note (Signed)
Addended by: Cherre Huger on: 10/29/2019 04:54 PM   Modules accepted: Orders, SmartSet

## 2019-10-29 NOTE — Patient Instructions (Signed)
Labor Induction  Labor induction is when steps are taken to cause a pregnant woman to begin the labor process. Most women go into labor on their own between 37 weeks and 42 weeks of pregnancy. When this does not happen or when there is a medical need for labor to begin, steps may be taken to induce labor. Labor induction causes a pregnant woman's uterus to contract. It also causes the cervix to soften (ripen), open (dilate), and thin out (efface). Usually, labor is not induced before 39 weeks of pregnancy unless there is a medical reason to do so. Your health care provider will determine if labor induction is needed. Before inducing labor, your health care provider will consider a number of factors, including:  Your medical condition and your baby's.  How many weeks along you are in your pregnancy.  How mature your baby's lungs are.  The condition of your cervix.  The position of your baby.  The size of your birth canal. What are some reasons for labor induction? Labor may be induced if:  Your health or your baby's health is at risk.  Your pregnancy is overdue by 1 week or more.  Your water breaks but labor does not start on its own.  There is a low amount of amniotic fluid around your baby. You may also choose (elect) to have labor induced at a certain time. Generally, elective labor induction is done no earlier than 39 weeks of pregnancy. What methods are used for labor induction? Methods used for labor induction include:  Prostaglandin medicine. This medicine starts contractions and causes the cervix to dilate and ripen. It can be taken by mouth (orally) or by being inserted into the vagina (suppository).  Inserting a small, thin tube (catheter) with a balloon into the vagina and then expanding the balloon with water to dilate the cervix.  Stripping the membranes. In this method, your health care provider gently separates amniotic sac tissue from the cervix. This causes the  cervix to stretch, which in turn causes the release of a hormone called progesterone. The hormone causes the uterus to contract. This procedure is often done during an office visit, after which you will be sent home to wait for contractions to begin.  Breaking the water. In this method, your health care provider uses a small instrument to make a small hole in the amniotic sac. This eventually causes the amniotic sac to break. Contractions should begin after a few hours.  Medicine to trigger or strengthen contractions. This medicine is given through an IV that is inserted into a vein in your arm. Except for membrane stripping, which can be done in a clinic, labor induction is done in the hospital so that you and your baby can be carefully monitored. How long does it take for labor to be induced? The length of time it takes to induce labor depends on how ready your body is for labor. Some inductions can take up to 2-3 days, while others may take less than a day. Induction may take longer if:  You are induced early in your pregnancy.  It is your first pregnancy.  Your cervix is not ready. What are some risks associated with labor induction? Some risks associated with labor induction include:  Changes in fetal heart rate, such as being too high, too low, or irregular (erratic).  Failed induction.  Infection in the mother or the baby.  Increased risk of having a cesarean delivery.  Fetal death.  Breaking off (abruption)   of the placenta from the uterus (rare).  Rupture of the uterus (very rare). When induction is needed for medical reasons, the benefits of induction generally outweigh the risks. What are some reasons for not inducing labor? Labor induction should not be done if:  Your baby does not tolerate contractions.  You have had previous surgeries on your uterus, such as a myomectomy, removal of fibroids, or a vertical scar from a previous cesarean delivery.  Your placenta lies  very low in your uterus and blocks the opening of the cervix (placenta previa).  Your baby is not in a head-down position.  The umbilical cord drops down into the birth canal in front of the baby.  There are unusual circumstances, such as the baby being very early (premature).  You have had more than 2 previous cesarean deliveries. Summary  Labor induction is when steps are taken to cause a pregnant woman to begin the labor process.  Labor induction causes a pregnant woman's uterus to contract. It also causes the cervix to ripen, dilate, and efface.  Labor is not induced before 39 weeks of pregnancy unless there is a medical reason to do so.  When induction is needed for medical reasons, the benefits of induction generally outweigh the risks. This information is not intended to replace advice given to you by your health care provider. Make sure you discuss any questions you have with your health care provider. Document Revised: 03/22/2017 Document Reviewed: 05/02/2016 Elsevier Patient Education  [place].   Balloon Catheter Placement for Cervical Ripening Balloon catheter placement for cervical ripening is a procedure to help your cervix start to soften (ripen) and open (dilate). It is done to prepare your body for labor induction. During this procedure, a thin tube (catheter) is placed through your cervix. A tiny balloon attached to the catheter is inflated with water. Pressure from the balloon is what helps your cervix start to open. Cervical ripening with a balloon catheter can make labor induction shorter and easier. You may have this procedure if:  Your cervix is not ready for labor.  Your health care provider has planned labor induction.  You are not having twins or multiples.  Your baby is in the head-down position.  You do not have any other pregnancy complications that require you to be monitored in the hospital after balloon catheter placement. If your health  care provider has recommended labor induction to stimulate a vaginal birth, this procedure may be started the day before induction. You will go home with the balloon in place and return to start induction in 12-24 hours. You may have this procedure and stay in the hospital so that your progress can be monitored as well. Tell a health care provider about:  Any allergies you have.  All medicines you are taking, including vitamins, herbs, eye drops, creams, and over-the-counter medicines.  Any blood disorders you have.  Any surgeries you have had.  Any medical conditions you have. What are the risks? Generally, this is a safe procedure. However, problems may occur, including:  Infection.  Bleeding.  Cramping or pain.  Difficulty passing urine.  The baby moving from the head-down position to a position with the feet or buttocks down (breech position). What happens before the procedure?  Your health care provider may check your baby's heartbeat (fetal monitoring) before the procedure.  You may be asked to empty your bladder. What happens during the procedure?   You will be positioned on the exam table as  if you were having a pelvic exam or Pap test.  Your health care provider may insert a medical instrument into your vagina (speculum) to see your cervix.  Your cervix may be cleaned with a germ-killing solution.  The catheter will be inserted through the opening of your cervix.  A balloon on the end of the catheter will be inflated with sterile water. Some catheters have two balloons, one on each side of the cervix.  Depending on the type of balloon catheter, the end of the catheter may be left free outside your cervix or taped to your leg. The procedure may vary among health care providers and hospitals. What can I expect after the procedure?  After the procedure, it is common to have: ? A feeling of pressure inside your vagina. ? Light vaginal bleeding (spotting).  You  may have fetal monitoring before going home.  You may be sent home with the catheter in place and asked to return to start your induction in about 12-24 hours. Follow these instructions at home:  Take over-the-counter and prescription medicines only as told by your health care provider.  Return to your normal activities at home as told by your health care provider. Ask your health care provider what activities are safe for you. Do not leave home until you return for labor induction.  You may shower at home. Do not take baths, swim, or use a hot tub unless your health care provider approves.  As your cervix opens, your catheter and balloon may fall out before you return for labor induction. Ask your health care provider what you should do if this happens.  Keep all follow-up visits as told by your health care provider. This is important. You will need to return to start induction as told by your health care provider. Contact your health care provider if:  You have chills or a fever.  You have constant pain or cramps (not contractions).  You have trouble passing urine.  Your water breaks.  You have vaginal bleeding that is heavier than spotting.  You have contractions that start to last longer and come closer together (about every 5 minutes).  The balloon catheter falls out before you return to start your induction. Summary  Cervical ripening with placement of a balloon catheter is an outpatient procedure to prepare you for labor induction.  Cervical ripening with a balloon catheter helps your cervix start to open for birth.  You will be positioned on the exam table. The catheter will be inserted through the opening of your cervix. A balloon on the end of the catheter will be inflated with water.  Pressure from the balloon will cause ripening of your cervix. You will go home with the balloon in place and return to start induction in 12-24 hours.  Contact your health care provider  if you have pain, fever, vaginal bleeding, or trouble passing urine. Also contact him or her if your water breaks, you start to go into labor, or your balloon catheter falls out before you return to start your induction. This information is not intended to replace advice given to you by your health care provider. Make sure you discuss any questions you have with your health care provider. Document Revised: 11/15/2017 Document Reviewed: 11/15/2017 Elsevier Patient Education  [place].   Augmentation of Labor  Augmentation of labor is when steps are taken to stimulate and strengthen contractions of the uterus during labor. This may be done when contractions have slowed down  or stopped, delaying progress of labor and delivery of the baby. Before beginning augmentation of labor, your health care provider will evaluate your condition, your baby's condition, the size and position of your baby, and the size of your birth canal. What are some reasons for labor augmentation? Augmentation of labor may be needed when:  You are in labor but your contractions are weak or irregular.  You are in labor but your contractions have stopped. What methods are used for labor augmentation? Labor augmentation may be done by:  Giving medicine that stimulates contractions (oxytocin). This is given through an IV tube that is inserted into one of your veins.  Breaking the fluid-filled sac that surrounds the fetus (amniotic sac). What are the risks associated with labor augmentation? Some risks of labor augmentation include:  Too much stimulation of the contractions, resulting in continuous, prolonged, or very strong contractions.  Increased risk of infection for you and your baby.  Tearing (rupture) of the uterus.  Breaking off (abruption) of the placenta.  Increased risk of cesarean, forceps, or vacuum delivery.  Excessive bleeding after delivery (postpartum hemorrhage).  Death of the baby (fetal  death). What are some reasons for not doing labor augmentation? Augmentation of labor should not be done if:  The baby is too big for the birth canal. This can be confirmed with an ultrasound.  The umbilical cord drops in front of the baby's head or breech part (prolapsed cord).  You have had a cesarean delivery and you had a vertical incision or you do not know what type of incision you had.  You have had surgery on or into your uterus.  You have an active herpes outbreak.  You have cervical cancer.  The placenta blocks the opening of the cervix (placenta previa) or you have other condition that is blocking the cervix or vaginal outlet.  The baby is lying sideways.  Your pelvis is will not permit the passage of the baby.  You are carrying more than two babies. Summary  Augmentation of labor is when steps are taken to stimulate and strengthen contractions of the uterus during labor. This may be done when contractions have slowed down or stopped, delaying progress of labor and delivery of the baby.  Labor augmentation may be done using medicine to stimulate contractions (oxytocin) or by breaking the fluid-filled sac that surrounds the fetus (amniotic sac).  Labor should not be augmented if you have had a cesarean delivery and you had a vertical incision or you do not know what type of incision you had. This information is not intended to replace advice given to you by your health care provider. Make sure you discuss any questions you have with your health care provider. Document Revised: 01/13/2019 Document Reviewed: 04/23/2016 Elsevier Patient Education  2020 [employer].

## 2019-10-30 ENCOUNTER — Ambulatory Visit (INDEPENDENT_AMBULATORY_CARE_PROVIDER_SITE_OTHER): Payer: Commercial Managed Care - PPO | Admitting: Certified Nurse Midwife

## 2019-10-30 VITALS — BP 140/87 | HR 108

## 2019-10-30 DIAGNOSIS — D259 Leiomyoma of uterus, unspecified: Secondary | ICD-10-CM | POA: Diagnosis not present

## 2019-10-30 DIAGNOSIS — Z3403 Encounter for supervision of normal first pregnancy, third trimester: Secondary | ICD-10-CM | POA: Diagnosis not present

## 2019-10-30 DIAGNOSIS — O3412 Maternal care for benign tumor of corpus uteri, second trimester: Secondary | ICD-10-CM | POA: Diagnosis not present

## 2019-10-30 DIAGNOSIS — Z3A40 40 weeks gestation of pregnancy: Secondary | ICD-10-CM

## 2019-10-30 NOTE — Progress Notes (Signed)
FOLEY BULB PROCEDURE NOTE:   I have verified that the patient is an appropriate candidate for outpatient foley bulb placement.  She has consented to foley bulb placement today. She has no concerns at this time. A pre-procedure NST performed today was reviewed and was found to be reactive.     The patient was placed in the dorsal lithotomy position.  A speculum was inserted into the vagina and the cervix was visualized.The cervix was noted to be 1 cm dilated  A foley catheter was advanced into the cervix slightly pass the internal cervical os.  The foley balloon was filled with 40 cc of normal saline.  Gentle traction was placed on the foley bulb, and the catheter was taped to the medial portion of the thigh.  The patient tolerated the procedure well.  A post-procedure NST was performed. Sterile technique was observed throughout.      NONSTRESS TEST INTERPRETATION  INDICATIONS: Foley bulb induction placement  FHR baseline: 130 bpm (pre-procedure) and 120  bpm (post procedure) RESULTS:Reactive x 2.  COMMENTS: Irregular contractions   PLAN: 1. To arrive at Labor and Delivery for scheduled induction of labor tomorrow (10/31/2019 at 0800) or sooner if needed.  She was given post-procedure instructions.     Dani Gobble, CNM [facility], [facility] 10/30/19 5:00 PM

## 2019-10-30 NOTE — Patient Instructions (Signed)

## 2019-10-31 ENCOUNTER — Encounter: Payer: Self-pay | Admitting: Certified Nurse Midwife

## 2019-10-31 ENCOUNTER — Inpatient Hospital Stay
Admission: EM | Admit: 2019-10-31 | Discharge: 2019-11-04 | DRG: 787 | Disposition: A | Discharge status: Home / Self Care | Payer: Commercial Managed Care - PPO | Attending: Certified Nurse Midwife | Admitting: Certified Nurse Midwife

## 2019-10-31 ENCOUNTER — Other Ambulatory Visit: Payer: Self-pay

## 2019-10-31 DIAGNOSIS — O480 Post-term pregnancy: Principal | ICD-10-CM | POA: Diagnosis present

## 2019-10-31 DIAGNOSIS — O9081 Anemia of the puerperium: Secondary | ICD-10-CM | POA: Diagnosis not present

## 2019-10-31 DIAGNOSIS — O09519 Supervision of elderly primigravida, unspecified trimester: Secondary | ICD-10-CM

## 2019-10-31 DIAGNOSIS — D259 Leiomyoma of uterus, unspecified: Secondary | ICD-10-CM | POA: Diagnosis present

## 2019-10-31 DIAGNOSIS — O09899 Supervision of other high risk pregnancies, unspecified trimester: Principal | ICD-10-CM

## 2019-10-31 DIAGNOSIS — O99814 Abnormal glucose complicating childbirth: Secondary | ICD-10-CM | POA: Diagnosis present

## 2019-10-31 DIAGNOSIS — Z2839 Other underimmunization status: Secondary | ICD-10-CM

## 2019-10-31 DIAGNOSIS — D252 Subserosal leiomyoma of uterus: Secondary | ICD-10-CM | POA: Diagnosis present

## 2019-10-31 DIAGNOSIS — Z3490 Encounter for supervision of normal pregnancy, unspecified, unspecified trimester: Secondary | ICD-10-CM | POA: Diagnosis present

## 2019-10-31 DIAGNOSIS — D250 Submucous leiomyoma of uterus: Secondary | ICD-10-CM | POA: Diagnosis present

## 2019-10-31 DIAGNOSIS — Z283 Underimmunization status: Secondary | ICD-10-CM | POA: Diagnosis not present

## 2019-10-31 DIAGNOSIS — J45909 Unspecified asthma, uncomplicated: Secondary | ICD-10-CM | POA: Diagnosis present

## 2019-10-31 DIAGNOSIS — R7309 Other abnormal glucose: Secondary | ICD-10-CM | POA: Diagnosis present

## 2019-10-31 DIAGNOSIS — D62 Acute posthemorrhagic anemia: Secondary | ICD-10-CM | POA: Diagnosis not present

## 2019-10-31 DIAGNOSIS — O3412 Maternal care for benign tumor of corpus uteri, second trimester: Secondary | ICD-10-CM | POA: Diagnosis present

## 2019-10-31 DIAGNOSIS — Z6710 Type A blood, Rh positive: Secondary | ICD-10-CM | POA: Diagnosis present

## 2019-10-31 DIAGNOSIS — Z98891 History of uterine scar from previous surgery: Secondary | ICD-10-CM | POA: Diagnosis not present

## 2019-10-31 DIAGNOSIS — O09513 Supervision of elderly primigravida, third trimester: Secondary | ICD-10-CM | POA: Diagnosis not present

## 2019-10-31 DIAGNOSIS — Z20822 Contact with and (suspected) exposure to covid-19: Secondary | ICD-10-CM | POA: Diagnosis present

## 2019-10-31 DIAGNOSIS — Z862 Personal history of diseases of the blood and blood-forming organs and certain disorders involving the immune mechanism: Secondary | ICD-10-CM | POA: Diagnosis not present

## 2019-10-31 DIAGNOSIS — D573 Sickle-cell trait: Secondary | ICD-10-CM | POA: Diagnosis present

## 2019-10-31 DIAGNOSIS — Z3A40 40 weeks gestation of pregnancy: Secondary | ICD-10-CM

## 2019-10-31 DIAGNOSIS — O9952 Diseases of the respiratory system complicating childbirth: Secondary | ICD-10-CM | POA: Diagnosis present

## 2019-10-31 DIAGNOSIS — O3413 Maternal care for benign tumor of corpus uteri, third trimester: Secondary | ICD-10-CM | POA: Diagnosis present

## 2019-10-31 LAB — CBC
HCT: 35.5 % — ABNORMAL LOW (ref 36.0–46.0)
Hemoglobin: 12.2 g/dL (ref 12.0–15.0)
MCH: 28.5 pg (ref 26.0–34.0)
MCHC: 34.4 g/dL (ref 30.0–36.0)
MCV: 82.9 fL (ref 80.0–100.0)
Platelets: 188 10*3/uL (ref 150–400)
RBC: 4.28 MIL/uL (ref 3.87–5.11)
RDW: 15.4 % (ref 11.5–15.5)
WBC: 9.8 10*3/uL (ref 4.0–10.5)
nRBC: 0 % (ref 0.0–0.2)

## 2019-10-31 LAB — TYPE AND SCREEN
ABO/RH(D): A POS
Antibody Screen: NEGATIVE

## 2019-10-31 LAB — ABO/RH: ABO/RH(D): A POS

## 2019-10-31 LAB — SARS CORONAVIRUS 2 BY RT PCR (HOSPITAL ORDER, PERFORMED IN ~~LOC~~ HOSPITAL LAB): SARS Coronavirus 2: NEGATIVE

## 2019-10-31 MED ORDER — LIDOCAINE HCL (PF) 1 % IJ SOLN
30.0000 mL | INTRAMUSCULAR | Status: DC | PRN
  Filled 2019-10-31: qty 30

## 2019-10-31 MED ORDER — OXYTOCIN-SODIUM CHLORIDE 30-0.9 UT/500ML-% IV SOLN
2.5000 [IU]/h | INTRAVENOUS | Status: DC
  Filled 2019-10-31 (×2): qty 500

## 2019-10-31 MED ORDER — ONDANSETRON HCL 4 MG/2ML IJ SOLN
4.0000 mg | Freq: Four times a day (QID) | INTRAMUSCULAR | Status: DC | PRN
  Administered 2019-11-01: 4 mg via INTRAVENOUS
  Filled 2019-10-31: qty 2

## 2019-10-31 MED ORDER — OXYTOCIN 10 UNIT/ML IJ SOLN
INTRAMUSCULAR | Status: AC
  Filled 2019-10-31: qty 2

## 2019-10-31 MED ORDER — SOD CITRATE-CITRIC ACID 500-334 MG/5ML PO SOLN
30.0000 mL | ORAL | Status: DC | PRN
  Filled 2019-10-31: qty 30

## 2019-10-31 MED ORDER — OXYTOCIN BOLUS FROM INFUSION: 333.0000 mL | Freq: Once | INTRAVENOUS | Status: DC

## 2019-10-31 MED ORDER — OXYTOCIN-SODIUM CHLORIDE 30-0.9 UT/500ML-% IV SOLN
1.0000 m[IU]/min | INTRAVENOUS | Status: DC
  Administered 2019-11-01: 2 m[IU]/min via INTRAVENOUS
  Filled 2019-10-31: qty 500

## 2019-10-31 MED ORDER — AMMONIA AROMATIC IN INHA
RESPIRATORY_TRACT | Status: AC
  Filled 2019-10-31: qty 10

## 2019-10-31 MED ORDER — MISOPROSTOL 200 MCG PO TABS
ORAL_TABLET | ORAL | Status: AC
  Filled 2019-10-31: qty 4

## 2019-10-31 MED ORDER — BUTORPHANOL TARTRATE 1 MG/ML IJ SOLN: 1.0000 mg | INTRAMUSCULAR | Status: DC | PRN

## 2019-10-31 MED ORDER — LACTATED RINGERS IV SOLN: INTRAVENOUS | Status: DC

## 2019-10-31 MED ORDER — OXYCODONE-ACETAMINOPHEN 5-325 MG PO TABS: 1.0000 | ORAL_TABLET | ORAL | Status: DC | PRN

## 2019-10-31 MED ORDER — ZOLPIDEM TARTRATE 5 MG PO TABS: 5.0000 mg | ORAL_TABLET | Freq: Every evening | ORAL | Status: DC | PRN

## 2019-10-31 MED ORDER — MISOPROSTOL 25 MCG QUARTER TABLET
25.0000 ug | ORAL_TABLET | ORAL | Status: DC | PRN
  Administered 2019-10-31 (×3): 25 ug via ORAL
  Filled 2019-10-31 (×3): qty 1

## 2019-10-31 MED ORDER — TERBUTALINE SULFATE 1 MG/ML IJ SOLN: 0.2500 mg | Freq: Once | INTRAMUSCULAR | Status: DC | PRN

## 2019-10-31 MED ORDER — OXYCODONE-ACETAMINOPHEN 5-325 MG PO TABS: 2.0000 | ORAL_TABLET | ORAL | Status: DC | PRN

## 2019-10-31 MED ORDER — ACETAMINOPHEN 325 MG PO TABS: 650.0000 mg | ORAL_TABLET | ORAL | Status: DC | PRN

## 2019-10-31 MED ORDER — LACTATED RINGERS IV SOLN: 500.0000 mL | INTRAVENOUS | Status: DC | PRN

## 2019-10-31 NOTE — Progress Notes (Signed)
Patient ID: Sharon Norman, female   DOB: 1980/06/12, 39 y.o.   MRN: [id number]  Amandy Chubbuck is a 38 y.o. G1P0 at [redacted]w[redacted]d by ultrasound admitted for induction of labor due to Post dates. Due date 10/29/2019 with grade 3 placenta and fibroid uterus.  Subjective:  Patient resting quietly in bed, occasional feeling mild contractions.   Husband at bedside for continuous labor support.   Denies difficulty breathing or respiratory distress, chest pain, abdominal pain, vaginal bleeding, dysuria, and leg pain or swelling.   Denies Objective:  Temp:  [98.2 F (36.8 C)-98.4 F (36.9 C)] 98.4 F (36.9 C) (07/31 1918) Pulse Rate:  [79-100] 79 (07/31 2158) Resp:  [14-18] 18 (07/31 1918) BP: (126-151)/(80-90) 134/88 (07/31 2158) Weight:  [78.9 kg] 78.9 kg (07/31 0911)  Fetal Wellbeing:  Category I  UC:   irregular, every two (2) to four (4) minutes; soft resting tone  SVE:   Dilation: 3 Effacement (%): 50 Station: -3 Exam by:: A [employer] RN  Labs: Lab Results  Component Value Date   WBC 9.8 10/31/2019   HGB 12.2 10/31/2019   HCT 35.5 (L) 10/31/2019   MCV 82.9 10/31/2019   PLT 188 10/31/2019    Assessment:  Latiya Clyburnis a 39 y.o.G1P0 at [redacted]w[redacted]d be admitted for induction of labor, Rh positive, GBS negative, Fibroid uterus, Varicella non-immune  FHR Category I  Plan:  Plan of care discussed with patient and spouse. Agrees to start pitocin in next hour.   Encouraged position change and use of peanut ball.   Reviewed red flag symptoms and when to call.   Continue orders as written. Reassess as needed.    Dani Gobble  [facility], [facility] 10/31/2019, 11:56 PM

## 2019-10-31 NOTE — Progress Notes (Signed)
Patient ID: Tess Potts, female   DOB: 05-05-1980, 39 y.o.   MRN: [id number]  Valynn Schamberger is a 39 y.o. G1P0 at [redacted]w[redacted]d by ultrasound admitted for induction of labor due to Post dates. Due date 10/29/2019 with grade 3 placenta.  Subjective:  Resting quietly in bed with eyes closed. No questions or concerns.   Denies difficulty breathing or respiratory distress, chest pain, abdominal pain, vaginal bleeding, dysuria, and leg pain or swelling.   Objective:  Temp:  [98.3 F (36.8 C)] 98.3 F (36.8 C) (07/31 0911) Pulse Rate:  [98-108] 100 (07/31 0955) Resp:  [16] 16 (07/31 0911) BP: (126-140)/(87-88) 126/88 (07/31 0955) Weight:  [78.9 kg] 78.9 kg (07/31 0911)  Fetal Wellbeing:  Category I  UC:   irregular, every one (1) to nine (9) minutes; soft resting tone  SVE:   Dilation: 3 Effacement (%): 50 Station: -3 Exam by:: Diego Cory CNM  Labs: Lab Results  Component Value Date   WBC 9.8 10/31/2019   HGB 12.2 10/31/2019   HCT 35.5 (L) 10/31/2019   MCV 82.9 10/31/2019   PLT 188 10/31/2019    Assessment  Lakeyn Gawron is a 39 y.o. G1P0 at [redacted]w[redacted]d being admitted for induction of labor, Rh positive, GBS negative, Fibroid uterus, Varicella non-immune  FHR Category I  Plan:  May saline lock IV.   Encouraged position change and use of peanut ball.   Reviewed red flag symptoms and when to call.   Continue orders as written. Reassess as needed.    Dani Gobble  [facility], [facility] 10/31/2019, 2:52 PM

## 2019-10-31 NOTE — H&P (Signed)
Obstetric History and Physical  Sharon Norman is a 39 y.o. G1P0 with IUP at [redacted]w[redacted]d admitted for induction of labor due to grade 3 placenta, fibroid uterus, and postdates pregnancy.   Patient states she has been having irregular contractions, none vaginal bleeding, intact membranes, with active fetal movement.    Denies difficulty breathing or respiratory distress, chest pain, abdominal pain, dysuria, and leg pain or swelling.   Prenatal Course  Source of Care: [facility]-initial visit at 10 weeks, total visits:   Pregnancy complications or risks:   Patient Active Problem List   Diagnosis Date Noted  . Encounter for induction of labor Nov 17, 2019  . Type A blood, Rh positive 11-17-19  . Sickle cell trait ([place]) 11-17-2019  . Abnormal glucose level 11/17/2019  . Carpal tunnel syndrome during pregnancy 07/09/2019  . Swelling of lower extremity during pregnancy in second trimester 07/09/2019  . Uterine fibroids affecting pregnancy in second trimester 06/05/2019  . LGSIL on Pap smear of cervix 05/23/2019  . Susceptible to varicella (non-immune), currently pregnant 05/22/2019   Prenatal labs and studies:  ABO, Rh: --/--/PENDING November 17, 2022 0847)  Antibody: PENDING 11-17-2022 0847)  Rubella: 1.49 (01/04 1638)  Varicella: <135 (01/04 1638)  RPR: Non Reactive (05/11 1410)   HBsAg: Negative (01/04 1638)   HIV: Non Reactive (01/04 1638)   UUV:OZDGUYQI/-- (07/08 1642)  1 hr Glucola: 169 (05/11 1410)  Gestational Glucose Tolerance: 73, 174, 141, 100 (05/28 3474)  Genetic screening: Low risk female (01/04 1609)  Anatomy US: Complete, normal (03/10 1536)  Past Medical History:  Diagnosis Date  . Anemia   . Asthma   . Fibroid   . Sickle cell trait [facility])     Past Surgical History:  Procedure Laterality Date  . left ovary removed    . WISDOM TOOTH EXTRACTION      OB History  Gravida Para Term Preterm AB Living  1            SAB TAB Ectopic Multiple Live Births               #  Outcome Date GA Lbr Len/2nd Weight Sex Delivery Anes PTL Lv  1 Current             Social History   Socioeconomic History  . Marital status: Married    Spouse name: Not on file  . Number of children: Not on file  . Years of education: Not on file  . Highest education level: Not on file  Occupational History  . Not on file  Tobacco Use  . Smoking status: Never Smoker  . Smokeless tobacco: Never Used  Vaping Use  . Vaping Use: Never used  Substance and Sexual Activity  . Alcohol use: Not Currently  . Drug use: Not Currently  . Sexual activity: Not Currently    Birth control/protection: Abstinence  Other Topics Concern  . Not on file  Social History Narrative  . Not on file   Social Determinants of Health   Financial Resource Strain:   . Difficulty of Paying Living Expenses:   Food Insecurity:   . Worried About Charity fundraiser in the Last Year:   . Arboriculturist in the Last Year:   Transportation Needs:   . Film/video editor (Medical):   Marland Kitchen Lack of Transportation (Non-Medical):   Physical Activity:   . Days of Exercise per Week:   . Minutes of Exercise per Session:   Stress:   . Feeling of Stress :  Social Connections:   . Frequency of Communication with Friends and Family:   . Frequency of Social Gatherings with Friends and Family:   . Attends Religious Services:   . Active Member of Clubs or Organizations:   . Attends Archivist Meetings:   Marland Kitchen Marital Status:     Family History  Problem Relation Age of Onset  . Sickle cell anemia Mother   . Cataracts Mother   . Healthy Father     Medications Prior to Admission  Medication Sig Dispense Refill Last Dose  . albuterol (VENTOLIN HFA) 108 (90 Base) MCG/ACT inhaler    Past Month at Unknown time  . Prenatal Vit-Fe Fumarate-FA (PRENATAL MULTIVITAMIN) TABS tablet Take 1 tablet by mouth daily at 12 noon.    Past Week at Unknown time  . ferrous sulfate 325 (65 FE) MG tablet Take 325 mg by mouth  daily with breakfast. (Patient not taking: Reported on 10/31/2019)   Not Taking at Unknown time  . traMADol (ULTRAM) 50 MG tablet Take 1 tablet (50 mg total) by mouth every 6 (six) hours as needed. (Patient not taking: Reported on 10/31/2019) 30 tablet 0 Not Taking at Unknown time    Allergies  Allergen Reactions  . Latex Rash    Review of Systems: Negative except for what is mentioned in HPI.  Physical Exam:  Temp:  [98.3 F (36.8 C)] 98.3 F (36.8 C) (07/31 0911) Pulse Rate:  [98-108] 100 (07/31 0955) Resp:  [16] 16 (07/31 0911) BP: (126-140)/(87-88) 126/88 (07/31 0955) Weight:  [78.9 kg] 78.9 kg (07/31 0911)  GENERAL: Well-developed, well-nourished female in no acute distress.   LUNGS: Clear to auscultation bilaterally.   HEART: Regular rate and rhythm.  ABDOMEN: Soft, nontender, nondistended, gravid.  EXTREMITIES: Nontender, no edema, 2+ distal pulses.  Cervical Exam: Dilation: 3 Effacement (%): 50 Station: -3 Exam by:: Sharon Norman CNM  FHR Category I  Contractions: Irregular, soft resting tone   Pertinent Labs/Studies:    Results for orders placed or performed during the hospital encounter of 10/31/19 (from the past 24 hour(s))  Type and screen     Status: None (Preliminary result)   Collection Time: 10/31/19  8:47 AM  Result Value Ref Range   ABO/RH(D) PENDING    Antibody Screen PENDING    Sample Expiration      11/03/2019,2359 Performed at [facility], [address] [place], [place] [address]   SARS Coronavirus 2 by RT PCR (hospital order, performed in [place] hospital lab) Nasopharyngeal Nasopharyngeal Swab     Status: None   Collection Time: 10/31/19  8:47 AM   Specimen: Nasopharyngeal Swab  Result Value Ref Range   SARS Coronavirus 2 NEGATIVE NEGATIVE  CBC     Status: Abnormal   Collection Time: 10/31/19  9:33 AM  Result Value Ref Range   WBC 9.8 4.0 - 10.5 K/uL   RBC 4.28 3.87 - 5.11 MIL/uL   Hemoglobin 12.2 12.0 - 15.0 g/dL   HCT  35.5 (L) 36 - 46 %   MCV 82.9 80.0 - 100.0 fL   MCH 28.5 26.0 - 34.0 pg   MCHC 34.4 30.0 - 36.0 g/dL   RDW 15.4 11.5 - 15.5 %   Platelets 188 150 - 400 K/uL   nRBC 0.0 0.0 - 0.2 %    Assessment :  Sharon Norman is a 39 y.o. G1P0 at [redacted]w[redacted]d being admitted for induction of labor, Rh positive, GBS negative, Fibroid uterus, Varicella non-immune  FHR Category  I  Plan:  Admit to birthing suites, see orders.   Induction/Augmentation as needed, per protocol.  Delivery plan: Hopeful for vaginal birth.   Dr. Marcelline Mates notified of admission and plan of care.    Sharon Norman, CNM [facility], [facility] 10/31/19 10:26 AM

## 2019-11-01 ENCOUNTER — Inpatient Hospital Stay: Admit: 2019-11-01 | Payer: Commercial Managed Care - PPO | Admitting: Obstetrics and Gynecology

## 2019-11-01 ENCOUNTER — Encounter
Admission: EM | Disposition: A | Discharge status: Home / Self Care | Payer: Self-pay | Source: Home / Self Care | Attending: Obstetrics and Gynecology

## 2019-11-01 ENCOUNTER — Inpatient Hospital Stay: Payer: Commercial Managed Care - PPO | Admitting: Anesthesiology

## 2019-11-01 ENCOUNTER — Encounter: Payer: Self-pay | Admitting: Obstetrics and Gynecology

## 2019-11-01 DIAGNOSIS — O09519 Supervision of elderly primigravida, unspecified trimester: Secondary | ICD-10-CM

## 2019-11-01 LAB — RPR: RPR Ser Ql: NONREACTIVE

## 2019-11-01 SURGERY — Surgical Case
Anesthesia: Epidural

## 2019-11-01 SURGERY — Surgical Case
Anesthesia: Spinal

## 2019-11-01 MED ORDER — FENTANYL 2.5 MCG/ML W/ROPIVACAINE 0.15% IN NS 100 ML EPIDURAL (ARMC)
EPIDURAL | Status: AC
  Filled 2019-11-01: qty 100

## 2019-11-01 MED ORDER — BUPIVACAINE LIPOSOME 1.3 % IJ SUSP
INTRAMUSCULAR | Status: AC
  Filled 2019-11-01: qty 20

## 2019-11-01 MED ORDER — LIDOCAINE HCL (PF) 1 % IJ SOLN
INTRAMUSCULAR | Status: DC | PRN
  Administered 2019-11-01: 2 mL

## 2019-11-01 MED ORDER — SODIUM CHLORIDE 0.9 % IV SOLN
500.0000 mg | INTRAVENOUS | Status: DC
  Filled 2019-11-01: qty 500

## 2019-11-01 MED ORDER — EPHEDRINE 5 MG/ML INJ: 10.0000 mg | INTRAVENOUS | Status: DC | PRN

## 2019-11-01 MED ORDER — FENTANYL CITRATE (PF) 100 MCG/2ML IJ SOLN
INTRAMUSCULAR | Status: AC
  Filled 2019-11-01: qty 2

## 2019-11-01 MED ORDER — HYDROMORPHONE HCL 1 MG/ML IJ SOLN
INTRAMUSCULAR | Status: AC
  Filled 2019-11-01: qty 1

## 2019-11-01 MED ORDER — PHENYLEPHRINE 40 MCG/ML (10ML) SYRINGE FOR IV PUSH (FOR BLOOD PRESSURE SUPPORT): 80.0000 ug | PREFILLED_SYRINGE | INTRAVENOUS | Status: DC | PRN

## 2019-11-01 MED ORDER — SODIUM CHLORIDE (PF) 0.9 % IJ SOLN
INTRAMUSCULAR | Status: AC
  Filled 2019-11-01: qty 50

## 2019-11-01 MED ORDER — SODIUM CHLORIDE 0.9 % IV SOLN: INTRAVENOUS | Status: DC | PRN

## 2019-11-01 MED ORDER — LIDOCAINE HCL (PF) 2 % IJ SOLN
INTRAMUSCULAR | Status: AC
  Filled 2019-11-01: qty 10

## 2019-11-01 MED ORDER — ONDANSETRON HCL 4 MG/2ML IJ SOLN
INTRAMUSCULAR | Status: AC
  Filled 2019-11-01: qty 2

## 2019-11-01 MED ORDER — DIPHENHYDRAMINE HCL 50 MG/ML IJ SOLN: 12.5000 mg | INTRAMUSCULAR | Status: DC | PRN

## 2019-11-01 MED ORDER — LIDOCAINE HCL (PF) 2 % IJ SOLN
INTRAMUSCULAR | Status: AC
  Filled 2019-11-01: qty 5

## 2019-11-01 MED ORDER — BUPIVACAINE LIPOSOME 1.3 % IJ SUSP: 20.0000 mL | Freq: Once | INTRAMUSCULAR | Status: DC

## 2019-11-01 MED ORDER — CEFAZOLIN SODIUM-DEXTROSE 2-4 GM/100ML-% IV SOLN
2.0000 g | INTRAVENOUS | Status: DC
  Filled 2019-11-01: qty 100

## 2019-11-01 MED ORDER — FENTANYL 2.5 MCG/ML W/ROPIVACAINE 0.15% IN NS 100 ML EPIDURAL (ARMC)
12.0000 mL/h | EPIDURAL | Status: DC
  Administered 2019-11-01 (×3): 12 mL/h via EPIDURAL
  Filled 2019-11-01 (×2): qty 100

## 2019-11-01 MED ORDER — BUPIVACAINE HCL (PF) 0.25 % IJ SOLN: INTRAMUSCULAR | Status: DC | PRN

## 2019-11-01 MED ORDER — SODIUM CHLORIDE 0.9 % IV SOLN
INTRAVENOUS | Status: DC | PRN
  Administered 2019-11-01 (×3): 5 mL via EPIDURAL

## 2019-11-01 MED ORDER — BUPIVACAINE HCL (PF) 0.5 % IJ SOLN
INTRAMUSCULAR | Status: AC
  Filled 2019-11-01: qty 30

## 2019-11-01 MED ORDER — TRANEXAMIC ACID-NACL 1000-0.7 MG/100ML-% IV SOLN
1000.0000 mg | INTRAVENOUS | Status: AC
  Administered 2019-11-02: 1000 mg via INTRAVENOUS
  Filled 2019-11-01: qty 100

## 2019-11-01 MED ORDER — OXYTOCIN-SODIUM CHLORIDE 30-0.9 UT/500ML-% IV SOLN
INTRAVENOUS | Status: AC
  Filled 2019-11-01: qty 500

## 2019-11-01 MED ORDER — SOD CITRATE-CITRIC ACID 500-334 MG/5ML PO SOLN
30.0000 mL | ORAL | Status: AC
  Administered 2019-11-01: 30 mL via ORAL

## 2019-11-01 MED ORDER — CALCIUM CARBONATE ANTACID 500 MG PO CHEW
400.0000 mg | CHEWABLE_TABLET | Freq: Once | ORAL | Status: AC
  Administered 2019-11-01: 400 mg via ORAL
  Filled 2019-11-01: qty 2

## 2019-11-01 MED ORDER — LACTATED RINGERS IV SOLN
500.0000 mL | Freq: Once | INTRAVENOUS | Status: AC
  Administered 2019-11-01: 500 mL via INTRAVENOUS

## 2019-11-01 MED ORDER — ACETAMINOPHEN 10 MG/ML IV SOLN
INTRAVENOUS | Status: AC
  Filled 2019-11-01: qty 100

## 2019-11-01 NOTE — Progress Notes (Signed)
Patient ID: Sharon Norman, female   DOB: December 01, 1980, 39 y.o.   MRN: [id number]  Sharon Norman is a 39 y.o. G1P0 at [redacted]w[redacted]d by ultrasound admitted for induction of labor due to Post dates. Due date 10/29/2019 with grade 3 placenta and fibroid uterus.  Subjective:  Patient reports pain relief since epidural placement. Husband at bedside for continuous labor support.   Denies difficulty breathing or respiratory distress, chest pain, vaginal bleeding, dysuria, and leg pain or swelling.   Objective:  Temp:  [97.8 F (36.6 C)-98.4 F (36.9 C)] 97.8 F (36.6 C) (08/01 0823) Pulse Rate:  [75-100] 78 (08/01 0842) Resp:  [14-18] 18 (08/01 0502) BP: (113-151)/(61-90) 137/90 (08/01 0842) SpO2:  [96 %-100 %] 99 % (08/01 0931)  Fetal Wellbeing:  Category II  UC:   irregular, every one (1) to four (4) minutes; soft resting tone, pitocin infusing at 14 mu/min  SVE:   Dilation: 5 Effacement (%): 60 Station: -2 Exam by:: Diego Cory CNM  AROM moderate amount of fluid, meconium present  Labs: Lab Results  Component Value Date   WBC 9.8 10/31/2019   HGB 12.2 10/31/2019   HCT 35.5 (L) 10/31/2019   MCV 82.9 10/31/2019   PLT 188 10/31/2019    Assessment:  Sharon Norman a 39 y.o.G1P0 at [redacted]w[redacted]d be admitted for induction of labor, Rh positive, GBS negative, Fibroid uterus, Varicella non-immune  FHR Category II  Plan:  Encouraged position change and use of peanut ball.   Reviewed red flag symptoms and when to call.   Continue orders as written. Reassess as needed.   Update given to Dr. Marcelline Mates.    Dani Gobble  [facility], [facility] 11/01/2019, 9:46 AM

## 2019-11-01 NOTE — Progress Notes (Signed)
Patient ID: Sharon Norman, female   DOB: 07/15/1980, 39 y.o.   MRN: [id number]  Sharon Norman is a 39 y.o. G1P0 at [redacted]w[redacted]d by ultrasound admitted for induction of labor due to Post dates. Due date 10/29/2019 with grade 3 placenta and fibroid uterus.  Subjective:  Patient doing well, no question or concerns.    Denies difficulty breathing or respiratory distress, chest pain, vaginal bleeding, dysuria, and leg pain or swelling.   Objective:  Temp:  [97.5 F (36.4 C)-98.4 F (36.9 C)] 97.5 F (36.4 C) (08/01 1208) Pulse Rate:  [75-93] 78 (08/01 0842) Resp:  [14-18] 16 (08/01 1038) BP: (113-151)/(61-90) 137/90 (08/01 0842) SpO2:  [94 %-100 %] 100 % (08/01 1346)  Fetal Wellbeing:  Category II  UC:   irregular, every three (3) to six (6) minutes; soft resting tone, pitocin infusing at 10 mu/min  SVE:   Dilation: 5 Effacement (%): 60 Station: -2 Exam by:: Diego Cory CNM    Labs: Lab Results  Component Value Date   WBC 9.8 10/31/2019   HGB 12.2 10/31/2019   HCT 35.5 (L) 10/31/2019   MCV 82.9 10/31/2019   PLT 188 10/31/2019    Assessment:  Sharon Clyburnis a 39 y.o.G1P0 at [redacted]w[redacted]d be admitted for induction of labor, Rh positive, GBS negative, Fibroid uterus, Varicella non-immune  FHR Category II  Plan:  Discussed expected vs actual labor progress with patient as well as possible need to cesarean section, verbalized understanding.   Encouraged position change and use of peanut ball.   Reviewed red flag symptoms and when to call.   Continue orders as written. Reassess as needed.   Update given to Dr. Gwyndolyn Kaufman  [facility], [facility] 11/01/2019, 2:19 PM

## 2019-11-01 NOTE — Anesthesia Preprocedure Evaluation (Signed)
Anesthesia Evaluation  Patient identified by MRN, date of birth, ID band Patient awake    Reviewed: Allergy & Precautions, H&P , NPO status , Patient's Chart, lab work & pertinent test results  Airway Mallampati: III  TM Distance: >3 FB Neck ROM: full    Dental  (+) Teeth Intact   Pulmonary asthma ,           Cardiovascular Exercise Tolerance: Good negative cardio ROS       Neuro/Psych    GI/Hepatic negative GI ROS,   Endo/Other    Renal/GU   negative genitourinary   Musculoskeletal   Abdominal   Peds  Hematology   Anesthesia Other Findings Past Medical History: No date: Anemia No date: Asthma No date: Fibroid No date: Sickle cell trait ([facility])  Past Surgical History: No date: left ovary removed No date: WISDOM TOOTH EXTRACTION  BMI    Body Mass Index: 30.82 kg/m      Reproductive/Obstetrics (+) Pregnancy                             Anesthesia Physical Anesthesia Plan  ASA: II  Anesthesia Plan: Epidural   Post-op Pain Management:    Induction:   PONV Risk Score and Plan:   Airway Management Planned:   Additional Equipment:   Intra-op Plan:   Post-operative Plan:   Informed Consent: I have reviewed the patients History and Physical, chart, labs and discussed the procedure including the risks, benefits and alternatives for the proposed anesthesia with the patient or authorized representative who has indicated his/her understanding and acceptance.       Plan Discussed with: Anesthesiologist  Anesthesia Plan Comments:         Anesthesia Quick Evaluation

## 2019-11-01 NOTE — Progress Notes (Signed)
Patient ID: Michelina Mexicano, female   DOB: [birth year], 39 y.o.   MRN: [id number]  Ahriyah Vannest is a 39 y.o. G1P0 at [redacted]w[redacted]d by ultrasound admitted for induction of labor due to Post dates. Due date 10/29/2019 with grade 3 placenta and fibroid uterus.  Subjective:  Patient resting quietly in bed, occasional feeling mild to moderate contractions.   Husband at bedside for continuous labor support.   Denies difficulty breathing or respiratory distress, chest pain, vaginal bleeding, dysuria, and leg pain or swelling.   Denies Objective:  Temp:  [98.1 F (36.7 C)-98.4 F (36.9 C)] 98.1 F (36.7 C) (08/01 0027) Pulse Rate:  [75-100] 75 (08/01 0027) Resp:  [14-18] 16 (08/01 0027) BP: (126-151)/(77-90) 141/77 (08/01 0027) Weight:  [78.9 kg] 78.9 kg (07/31 0911)  Fetal Wellbeing:  Category I  UC:   regular, every two (2) to four (4) minutes; soft resting tone, pitocin infusing at 6 mu/min  SVE:   Dilation: 3 Effacement (%): 50 Station: -3 Exam by:: A [employer] RN  Labs: Lab Results  Component Value Date   WBC 9.8 10/31/2019   HGB 12.2 10/31/2019   HCT 35.5 (L) 10/31/2019   MCV 82.9 10/31/2019   PLT 188 10/31/2019    Assessment:  Shahd Clyburnis a 39 y.o.G1P0 at [redacted]w[redacted]d be admitted for induction of labor, Rh positive, GBS negative, Fibroid uterus, Varicella non-immune  FHR Category I  Plan:  Encouraged position change and use of peanut ball.   Reviewed red flag symptoms and when to call.   Continue orders as written. Reassess as needed.    Dani Gobble  [facility], [facility] 11/01/2019, 2:34 AM

## 2019-11-01 NOTE — Progress Notes (Signed)
Patient ID: Wilmina Maxham, female   DOB: 03-28-1981, 39 y.o.   MRN: [id number]  Erline Siddoway is a 39 y.o. G1P0 at [redacted]w[redacted]d by ultrasound admitted for induction of labor due to Post dates. Due date 10/29/2019 with grade 3 placenta and fibroid uterus.  Subjective:  Patient tearful breathing through contractions.   Husband at bedside for continuous labor support.   Denies difficulty breathing or respiratory distress, chest pain, vaginal bleeding, dysuria, and leg pain or swelling.   Objective:  Temp:  [98.1 F (36.7 C)-98.4 F (36.9 C)] 98.1 F (36.7 C) (08/01 0027) Pulse Rate:  [75-100] 75 (08/01 0027) Resp:  [14-18] 16 (08/01 0027) BP: (126-151)/(77-90) 141/77 (08/01 0027) Weight:  [78.9 kg] 78.9 kg (07/31 0911)  Fetal Wellbeing:  Category I  UC:   regular, every two (2) to four (4) minutes; soft resting tone, pitocin infusing at 8 mu/min  SVE:   Dilation: 3 Effacement (%): 50 Station: -3 Exam by:: Diego Cory CNM  Labs: Lab Results  Component Value Date   WBC 9.8 10/31/2019   HGB 12.2 10/31/2019   HCT 35.5 (L) 10/31/2019   MCV 82.9 10/31/2019   PLT 188 10/31/2019    Assessment:  Rhianon Clyburnis a 39 y.o.G1P0 at [redacted]w[redacted]d be admitted for induction of labor, Rh positive, GBS negative, Fibroid uterus, Varicella non-immune  FHR Category I  Plan:  Pain management options discussed with patient. Decision for epidural placement.   RN to notify anesthesia provider.   Reviewed red flag symptoms and when to call.   Continue orders as written. Reassess as needed.    Dani Gobble  [facility], [facility] 11/01/2019, 4:10 AM

## 2019-11-01 NOTE — Progress Notes (Signed)
Patient ID: Sharon Norman, female   DOB: June 29, 1980, 39 y.o.   MRN: [id number]  Pitocin discontinued, see MAR.  Fetal heart rate tracing discussed with patient and spouse, Category I. SVE unchanged since 0906.    The patient is understanding of the unscheduled procedure and is aware of and accepting of all surgical risks, including but not limited to: bleeding which may require transfusion or reoperation; infection which may require antibiotics; injury to bowel, bladder, ureters or other surrounding organs which may require repair; injury to the fetus; need for additional procedures including hysterectomy in the event of life-threatening complications; placental abnormalities wth subsequent pregnancies; incisional problems; blood clot disorders which may require blood thinners;, and other postoperative/anesthesia complications. The patient is in agreement with the proposed plan, and gives informed written consent for the procedure. All questions have been answered.   Dani Gobble, CNM [facility], [facility] 11/01/19 9:23 PM

## 2019-11-01 NOTE — Progress Notes (Addendum)
Contacted by Dani Gobble, CNM regarding plan of care for patient.    Patient is a 39 y.o. G1P0 female at [redacted]w[redacted]d with fibroid uterus, Grade 3 placenta, anemia, Category II fetal tracing (recurrent variables and lates), now with failed induction. Discussion had with patient on need for C-section. Previously counseled on procedure, risks, and benefits by midwife, however reiterated information again to patient. Also discussed increased risk of blood loss due to fibroid uterus, possibility of need for vertical incision on uterus, and risk of requiring future C-sections for delivery mode. Patient notes understanding.     Rubie Maid, MD [facility]

## 2019-11-01 NOTE — Progress Notes (Signed)
Delay in proceeding to C-section after urgent C-section called at 9:15 pm due to IT issues and posting. Spoke with IT and OR staffing regarding urgent nature of procedure.  Will proceed at this time.    Rubie Maid, MD [facility]

## 2019-11-01 NOTE — Anesthesia Procedure Notes (Signed)
Epidural Patient location during procedure: OB Start time: 11/01/2019 4:36 AM End time: 11/01/2019 4:52 AM  Staffing Anesthesiologist: Tera Mater, MD Performed: anesthesiologist   Preanesthetic Checklist Completed: patient identified, IV checked, site marked, risks and benefits discussed, surgical consent, monitors and equipment checked, pre-op evaluation and timeout performed  Epidural Patient position: sitting Prep: ChloraPrep Patient monitoring: heart rate, continuous pulse ox and blood pressure Approach: midline Location: L4-L5 Injection technique: LOR saline  Needle:  Needle type: Tuohy  Needle gauge: 18 G Needle length: 9 cm and 9 Catheter type: closed end flexible Catheter size: 20 Guage Test dose: negative and Other  Assessment Events: blood not aspirated, injection not painful, no injection resistance, no paresthesia and negative IV test  Additional Notes Risks and benefits of procedure discussed with patient.  Risks including but not limited to infection, spinal/epidural hematoma, nerve injury, post dural puncture headache, and inadequate/failed block.  Patient expressed understanding and consented to epidural placement. Negative dural puncture.  Negative aspiration.  Negative paresthesia on injection.  Dose given in divided aliquots.  Patient tolerated the procedure well with no immediate complications.  Reason for block:procedure for pain

## 2019-11-02 ENCOUNTER — Encounter: Payer: Self-pay | Admitting: Obstetrics and Gynecology

## 2019-11-02 DIAGNOSIS — O3412 Maternal care for benign tumor of corpus uteri, second trimester: Secondary | ICD-10-CM | POA: Diagnosis not present

## 2019-11-02 DIAGNOSIS — O09513 Supervision of elderly primigravida, third trimester: Secondary | ICD-10-CM | POA: Diagnosis not present

## 2019-11-02 DIAGNOSIS — Z98891 History of uterine scar from previous surgery: Secondary | ICD-10-CM | POA: Diagnosis not present

## 2019-11-02 DIAGNOSIS — Z283 Underimmunization status: Secondary | ICD-10-CM

## 2019-11-02 DIAGNOSIS — Z862 Personal history of diseases of the blood and blood-forming organs and certain disorders involving the immune mechanism: Secondary | ICD-10-CM

## 2019-11-02 DIAGNOSIS — O480 Post-term pregnancy: Principal | ICD-10-CM | POA: Diagnosis present

## 2019-11-02 DIAGNOSIS — D259 Leiomyoma of uterus, unspecified: Secondary | ICD-10-CM

## 2019-11-02 DIAGNOSIS — O09899 Supervision of other high risk pregnancies, unspecified trimester: Secondary | ICD-10-CM | POA: Diagnosis not present

## 2019-11-02 DIAGNOSIS — D573 Sickle-cell trait: Secondary | ICD-10-CM | POA: Diagnosis not present

## 2019-11-02 LAB — CBC
HCT: 25.2 % — ABNORMAL LOW (ref 36.0–46.0)
Hemoglobin: 8.5 g/dL — ABNORMAL LOW (ref 12.0–15.0)
MCH: 28.4 pg (ref 26.0–34.0)
MCHC: 33.7 g/dL (ref 30.0–36.0)
MCV: 84.3 fL (ref 80.0–100.0)
Platelets: 145 10*3/uL — ABNORMAL LOW (ref 150–400)
RBC: 2.99 MIL/uL — ABNORMAL LOW (ref 3.87–5.11)
RDW: 15.5 % (ref 11.5–15.5)
WBC: 15.6 10*3/uL — ABNORMAL HIGH (ref 4.0–10.5)
nRBC: 0 % (ref 0.0–0.2)

## 2019-11-02 MED ORDER — KETOROLAC TROMETHAMINE 30 MG/ML IJ SOLN
30.0000 mg | Freq: Four times a day (QID) | INTRAMUSCULAR | Status: AC
  Administered 2019-11-02 (×4): 30 mg via INTRAVENOUS
  Filled 2019-11-02 (×3): qty 1

## 2019-11-02 MED ORDER — WITCH HAZEL-GLYCERIN EX PADS: 1.0000 "application " | MEDICATED_PAD | CUTANEOUS | Status: DC | PRN

## 2019-11-02 MED ORDER — SIMETHICONE 80 MG PO CHEW
80.0000 mg | CHEWABLE_TABLET | ORAL | Status: DC
  Administered 2019-11-03 – 2019-11-04 (×2): 80 mg via ORAL
  Filled 2019-11-02 (×2): qty 1

## 2019-11-02 MED ORDER — VARICELLA VIRUS VACCINE LIVE 1350 PFU/0.5ML IJ SUSR
0.5000 mL | Freq: Once | INTRAMUSCULAR | Status: DC
  Filled 2019-11-02: qty 0.5

## 2019-11-02 MED ORDER — ONDANSETRON HCL 4 MG/2ML IJ SOLN
4.0000 mg | Freq: Four times a day (QID) | INTRAMUSCULAR | Status: DC | PRN
  Administered 2019-11-02 (×2): 4 mg via INTRAVENOUS
  Filled 2019-11-02 (×2): qty 2

## 2019-11-02 MED ORDER — KETOROLAC TROMETHAMINE 30 MG/ML IJ SOLN
30.0000 mg | Freq: Once | INTRAMUSCULAR | Status: DC
  Filled 2019-11-02: qty 1

## 2019-11-02 MED ORDER — COCONUT OIL OIL
1.0000 "application " | TOPICAL_OIL | Status: DC | PRN
  Filled 2019-11-02: qty 120

## 2019-11-02 MED ORDER — FERROUS SULFATE 325 (65 FE) MG PO TABS
325.0000 mg | ORAL_TABLET | Freq: Two times a day (BID) | ORAL | Status: DC
  Administered 2019-11-02 – 2019-11-04 (×5): 325 mg via ORAL
  Filled 2019-11-02 (×5): qty 1

## 2019-11-02 MED ORDER — PRENATAL MULTIVITAMIN CH
1.0000 | ORAL_TABLET | Freq: Every day | ORAL | Status: DC
  Administered 2019-11-02 – 2019-11-03 (×2): 1 via ORAL
  Filled 2019-11-02 (×2): qty 1

## 2019-11-02 MED ORDER — MENTHOL 3 MG MT LOZG
1.0000 | LOZENGE | OROMUCOSAL | Status: DC | PRN
  Filled 2019-11-02: qty 9

## 2019-11-02 MED ORDER — OXYCODONE HCL 5 MG PO TABS: 5.0000 mg | ORAL_TABLET | ORAL | Status: DC | PRN

## 2019-11-02 MED ORDER — HYDROMORPHONE HCL 1 MG/ML IJ SOLN
1.0000 mg | INTRAMUSCULAR | Status: DC | PRN
  Administered 2019-11-02 (×2): 1 mg via INTRAVENOUS
  Administered 2019-11-02 (×2): 2 mg via INTRAVENOUS
  Filled 2019-11-02: qty 2
  Filled 2019-11-02 (×2): qty 1
  Filled 2019-11-02: qty 2

## 2019-11-02 MED ORDER — DIPHENHYDRAMINE HCL 25 MG PO CAPS: 25.0000 mg | ORAL_CAPSULE | Freq: Four times a day (QID) | ORAL | Status: DC | PRN

## 2019-11-02 MED ORDER — IBUPROFEN 800 MG PO TABS
800.0000 mg | ORAL_TABLET | Freq: Four times a day (QID) | ORAL | Status: DC
  Administered 2019-11-03 – 2019-11-04 (×6): 800 mg via ORAL
  Filled 2019-11-02 (×7): qty 1

## 2019-11-02 MED ORDER — MAGNESIUM HYDROXIDE 400 MG/5ML PO SUSP
30.0000 mL | ORAL | Status: DC | PRN
  Filled 2019-11-02: qty 30

## 2019-11-02 MED ORDER — SENNOSIDES-DOCUSATE SODIUM 8.6-50 MG PO TABS
2.0000 | ORAL_TABLET | ORAL | Status: DC
  Administered 2019-11-03 – 2019-11-04 (×2): 2 via ORAL
  Filled 2019-11-02 (×2): qty 2

## 2019-11-02 MED ORDER — LACTATED RINGERS IV SOLN: INTRAVENOUS | Status: DC

## 2019-11-02 MED ORDER — GABAPENTIN 300 MG PO CAPS
300.0000 mg | ORAL_CAPSULE | Freq: Two times a day (BID) | ORAL | Status: DC
  Administered 2019-11-02 – 2019-11-04 (×6): 300 mg via ORAL
  Filled 2019-11-02 (×6): qty 1

## 2019-11-02 MED ORDER — OXYCODONE-ACETAMINOPHEN 5-325 MG PO TABS
1.0000 | ORAL_TABLET | ORAL | Status: DC | PRN
  Administered 2019-11-02 – 2019-11-03 (×5): 2 via ORAL
  Filled 2019-11-02 (×5): qty 2

## 2019-11-02 MED ORDER — SIMETHICONE 80 MG PO CHEW
80.0000 mg | CHEWABLE_TABLET | ORAL | Status: DC | PRN
  Administered 2019-11-03: 80 mg via ORAL
  Filled 2019-11-02: qty 1

## 2019-11-02 MED ORDER — OXYCODONE-ACETAMINOPHEN 5-325 MG PO TABS: 2.0000 | ORAL_TABLET | ORAL | Status: DC | PRN

## 2019-11-02 MED ORDER — DIBUCAINE (PERIANAL) 1 % EX OINT: 1.0000 "application " | TOPICAL_OINTMENT | CUTANEOUS | Status: DC | PRN

## 2019-11-02 MED ORDER — OXYTOCIN-SODIUM CHLORIDE 30-0.9 UT/500ML-% IV SOLN: 2.5000 [IU]/h | INTRAVENOUS | Status: AC

## 2019-11-02 MED ORDER — TRAMADOL HCL 50 MG PO TABS: 50.0000 mg | ORAL_TABLET | Freq: Four times a day (QID) | ORAL | Status: DC | PRN

## 2019-11-02 MED ORDER — ZOLPIDEM TARTRATE 5 MG PO TABS: 5.0000 mg | ORAL_TABLET | Freq: Every evening | ORAL | Status: DC | PRN

## 2019-11-02 NOTE — Procedures (Signed)
Please see paper charting for C-section intraop documentation.

## 2019-11-02 NOTE — Lactation Note (Signed)
This note was copied from a baby's chart. Lactation Consultation Note  Patient Name: Sharon Norman [id number] Date: 11/02/2019    Mom asked to see lactation consultant.  Observed breast feeding with strong, rhythmic sucking and occasional swallows.  GOB very supportive and assisting mom.  Explained feeding cues and encouraged mom to put baby to the breast whenever she demonstrated feeding cues.  Handout given and reviewed on what to expect with newborn feeding the first 4 days of life and lactation community resources, but mom was exhausted and kept dozing off during the breast feeding and while educating.  Mom has [place], but reports the insurance company told her she could not get the pump because she had not met her deductable.  Discussed with [employer] and they said it could be a possibility, but they would be happy to discuss the issue with her and see if they could work it out.  Handout given from [place] for mom to call to discuss further.    Education might need to be reinforced because mom kept falling asleep.  Did not leave baby skin to skin with mom because she was falling asleep.  GOB changed diaper after feeding and continued to hold.  Every time I went back to check on mom she was sleeping and there was a Do Not Disturb sign on the door. Lactation name and number was written on the white board and encouraged mom and grandmother of baby to call with any questions, concerns or assistance.     Maternal Data    Feeding    LATCH Score                   Interventions    Lactation Tools Discussed/Used     Consult Status      Sharon Norman 11/02/2019, 4:37 PM

## 2019-11-02 NOTE — Anesthesia Postprocedure Evaluation (Signed)
Anesthesia Post Note  Patient: Sharon Norman  Procedure(s) Performed: AN AD HOC LABOR EPIDURAL  Patient location during evaluation: Mother Baby Anesthesia Type: Epidural Level of consciousness: awake, awake and alert and oriented Pain management: pain level controlled Vital Signs Assessment: post-procedure vital signs reviewed and stable Respiratory status: spontaneous breathing, respiratory function stable and nonlabored ventilation Cardiovascular status: blood pressure returned to baseline and stable Postop Assessment: no headache and no backache Anesthetic complications: no   No complications documented.   Last Vitals:  Vitals:   11/02/19 0307 11/02/19 0407  BP: (!) 118/86 119/73  Pulse: 84 93  Resp: 20 20  Temp: 36.9 C 36.7 C  SpO2: 99% 100%    Last Pain:  Vitals:   11/02/19 0704  TempSrc:   PainSc: 2                  Johnna Acosta

## 2019-11-02 NOTE — Lactation Note (Signed)
This note was copied from a baby's chart. Lactation Consultation Note  Patient Name: Sharon Norman [id number] Date: 11/02/2019 Reason for consult: Follow-up assessment;Mother's request;Difficult latch;Primapara;Term;Other (Comment) (Sometimes gets on tip of nipple-Sucks thumb & fingers)  Mom asking for breast feeding assistance.  When went in room Sharon Norman was sucking on her thumb.  Assisted mom with comfortable position in football hold on left breast which mom says she prefers.  She screamed as soon as her thumb was removed.  Mom and GOB were concerned that baby was not getting enough milk.  Demonstrated how much colostrum she could hand express which was more than most get in the first 24 hours.  Reviewed normal newborn stomach size, adequate intake and out put and supply and demand.  Sharon Norman latched as soon as we got her near the breast and began strong rhythmic sucking.  Demonstrated how to sandwich breast and gently touch chin to get wider mouth for deeper latch.  She sucked for 14 minutes on the left breast coming off a couple of times and having to re latch.  Mom reports some nipple soreness especially when she first pulls it into her mouth.  Explained transient nipple tenderness stressing importance keeping baby close to the breast for as deep of a latch as she could get.  Demonstrated how to hand express after feeding and rub on nipples to prevent bacteria, lubricate and help with tenderness.  No nipple trauma noted after breast feeding.  Shortly after coming off of the left breast, she was fussing, rooting and sucking on her fingers.  Mom liked the football hold better than the cradle hold because it kept her away from the incisional area.  Put her to the right breast where she once again started sucking immediately, but had a more shallow latch on this breast.  She had more trouble sustaining the latch on this side coming on and off the breast more.  She came off the right breast after 5 minutes  and seemed satiated.  Placed her back in the crib for mom to eat her dinner.  Coconut oil and comfort gels given and instructed in alternating use.  Earlier teaching reinforced while mom ate her dinner.  She started sucking on her fingers again.  Encouraged mom to call for assistance with putting her back to the breast if she did not settle down and kept sucking on her fingers.     Maternal Data Formula Feeding for Exclusion: No Has patient been taught Hand Expression?: Yes Does the patient have breastfeeding experience prior to this delivery?: No (Gr1)  Feeding Feeding Type: Breast Fed  LATCH Score Latch: Repeated attempts needed to sustain latch, nipple held in mouth throughout feeding, stimulation needed to elicit sucking reflex.  Audible Swallowing: A few with stimulation  Type of Nipple: Everted at rest and after stimulation (Slightly flat - everts with stimulation)  Comfort (Breast/Nipple): Filling, red/small blisters or bruises, mild/mod discomfort  Hold (Positioning): Assistance needed to correctly position infant at breast and maintain latch.  LATCH Score: 6  Interventions Interventions: Assisted with latch;Breast massage;Hand express;Reverse pressure;Breast compression;Adjust position;Support pillows;Position options;Coconut oil;Comfort gels  Lactation Tools Discussed/Used Tools: Coconut oil;Comfort gels WIC Program: No ([place])   Consult Status Consult Status: Follow-up Follow-up type: Call as needed    Jarold Motto 11/02/2019, 8:31 PM

## 2019-11-02 NOTE — Op Note (Signed)
Cesarean Section Procedure Note  Indications: failed induction and non-reassuring fetal status  Pre-operative Diagnosis: 40 week 4 day pregnancy, failed induction of labor, non-reassuring fetal tracing, advanced maternal age, fibroid uterus, Grade 3 placenta, history of anemia.  Post-operative Diagnosis: Same  Surgeon: Rubie Maid, MD  Assistants:    Dani Gobble, CNM .  An experienced assistant was required given the standard of surgical care given the complexity of the case.  This assistant was needed for exposure, dissection, suctioning, retraction, instrument exchange, and for overall help during the procedure.  Procedure: Primary low-transverse Cesarean Section  Anesthesia: Spinal anesthesia  Procedure Details: The patient was seen in the Holding Room. The risks, benefits, complications, treatment options, and expected outcomes were discussed with the patient.  The patient concurred with the proposed plan, giving informed consent.  The site of surgery properly noted/marked. The patient was taken to the Operating Room, identified as Sharon Norman and the procedure verified as C-Section Delivery. A Time Out was held and the above information confirmed.  After induction of anesthesia, the patient was draped and prepped in the usual sterile manner. Anesthesia was tested and noted to be adequate. A Pfannenstiel incision was made and carried down through the subcutaneous tissue to the fascia. Fascial incision was made and extended transversely. The fascia was separated from the underlying rectus tissue superiorly and inferiorly. The peritoneum was identified and entered. Peritoneal incision was extended longitudinally. The surgical assist was able to provide retraction to allow for clear visualization of surgical site. The utero-vesical peritoneal reflection was incised transversely and the bladder flap was bluntly freed from the lower uterine segment. A low transverse uterine incision was  made. Delivered from cephalic (occiput posterior) presentation was a 3490 gram Female with Apgar scores of 8 at one minute and 8 at five minutes.  The assistant was able to apply adequate fundal pressure to allow for successful delivery of the fetus. After the umbilical cord was clamped and cut cord blood was obtained for evaluation. The placenta was removed intact and appeared normal. The uterus was exteriorized and cleared of all clots and debris. The uterine outline noted 2 small fundal subserosal fibroids (~ 2-3 cm), and a medium-sized submucosal fibroid noted on the right lower aspect of the uterus, ~ 3 cm above incision site.  The fallopian tubes and ovaries appeared normal.  The uterine incision was closed with running locked sutures of 0-Vicryl.  A second suture of 0-Vicryl was used in an imbricating layer.  An area of the lower uterine segment below the incision site was noted to have some oozing, unable to be controlled with the bovie. A figure-of-eight suture was placed at the site.  Hemostasis was observed. Lavage was carried out until clear. The peritoneum was reapproximated with a running suture of 3-0 Vicryl. The muscle layer was reapproximated with a figure-of-eight suture of 3-0 Vicryl.  The fascia was then grasped and injected with 1.3% Exparel solution.  The fascia was reapproximated with a running suture of 1-0 Vicryl. The subcutaneous fat layer was then undermined and separated from the skin for the prevention of skin retraction at the incision site.  The skin was reapproximated with 4-0 Monocryl, and injected with the 1.3% Exparel solution.  A total of 77 ml of the Exparel solution was utilized.  Instrument, sponge, and needle counts were correct prior the abdominal closure and at the conclusion of the case.   Findings: Female infant, cephalic presentation, 7622 grams, with Apgar scores of 8 at one  minute and 8 at five minutes. Intact placenta with 3 vessel cord.  Meconium stained  amniotic fluid at amniotomy he uterine outline noted 2 small fundal subserosal fibroids (~ 2-3 cm), and a medium-sized submucosal fibroid (5-6 cm) noted on the right lower aspect of the uterus, ~ 3 cm above incision site.  The fallopian tubes and ovaries appeared normal.   Estimated Blood Loss:  1083 ml      Drains: foley catheter to gravity drainage, 200 ml of clear urine at end of the procedure         Total IV Fluids:  1988 ml  Specimens: Placenta and Disposition:  Sent to Pathology         Implants: None         Complications:  None; patient tolerated the procedure well.         Disposition: PACU - hemodynamically stable.         Condition: stable   Rubie Maid, MD [facility]

## 2019-11-02 NOTE — Progress Notes (Signed)
Postpartum Day # 1: Cesarean Delivery (primary)  Subjective: Patient reports nausea, vomiting and incisional pain. Has not yet attempted to ambulate. Foley catheter removed this morning but hasn't voided yet. Notes infant is latching well but mom has some positioning issues.   Objective: Vital signs in last 24 hours: Temp:  [97.5 F (36.4 C)-98.5 F (36.9 C)] 98 F (36.7 C) (08/02 0700) Pulse Rate:  [73-108] 73 (08/02 0700) Resp:  [10-25] 18 (08/02 0700) BP: (107-162)/(68-99) 120/78 (08/02 0700) SpO2:  [94 %-100 %] 100 % (08/02 0700)  I/O last 3 completed shifts: In: 3283.6 [P.O.:960; I.V.:2223.6; IV Piggyback:100] Out: 1410 [Urine:3650; Emesis/NG output:500; Blood:1083] Total I/O In: -  Out: 250 [Urine:250]    Physical Exam:  General: alert and no distress Lungs: clear to auscultation bilaterally Breasts: normal appearance, no masses or tenderness Heart: regular rate and rhythm, S1, S2 normal, no murmur, click, rub or gallop Abdomen: soft, non-tender; bowel sounds normal; no masses,  no organomegaly Pelvis: Lochia appropriate, Uterine Fundus firm, Incision: bandage clean/dry/intact Extremities: DVT Evaluation: No evidence of DVT seen on physical exam. Negative Homan's sign. No cords or calf tenderness. No significant calf/ankle edema.  Recent Labs    10/31/19 0933 11/02/19 0625  HGB 12.2 8.5*  HCT 35.5* 25.2*    Assessment/Plan: Status post Cesarean section.   Breastfeeding, Lactation consult  Contraception undecided.   Advance diet as tolerated. Anti-emetics as needed.  Continue PO pain management Encourage ambulation Discussed postpartum anemia, can treat with PO (or IV iron).  Abdominal binder and heating pad ordered.  Continue current care.  Rubie Maid, MD [facility]

## 2019-11-03 MED ORDER — ACETAMINOPHEN 325 MG PO TABS
650.0000 mg | ORAL_TABLET | ORAL | Status: DC | PRN
  Administered 2019-11-03 – 2019-11-04 (×5): 650 mg via ORAL
  Filled 2019-11-03 (×6): qty 2

## 2019-11-03 MED ORDER — OXYCODONE HCL 5 MG PO TABS
10.0000 mg | ORAL_TABLET | Freq: Four times a day (QID) | ORAL | Status: DC | PRN
  Administered 2019-11-03 – 2019-11-04 (×5): 10 mg via ORAL
  Filled 2019-11-03 (×5): qty 2

## 2019-11-03 NOTE — Progress Notes (Signed)
Pt unhappy with pain control and requesting a different type of pain medication. Nurse asked if she mentioned this to the midwife when she rounded this morning and pt said no. Pt listed off home pain medications (what she took before pregnancy) and nurse let Sharon Norman, CNM know this. Sharon Norman not comfortable prescribing any of these pain medications due to pt breastfeeding.  New pain control regimen ordered: motrin, tylenol, roxicodone  Encouraged pt to get up and move around. Pt planning to get in the shower and call nurse to remove pressure dressing.

## 2019-11-03 NOTE — Progress Notes (Signed)
Nurse tech in room to see about doing the baby's hearing screen and PKU; baby on mom's lap and is currently quiet; tech explained about the hearing screen and pku; pt responded with "I really want the baby to stay quiet and asleep since he (father of baby) is asleep, if you really have to then that's ok, but if not, the baby is sleeping"; tech said she would let the nurse know; tech also asked if the baby has eaten since 0130; the pt responded "no"; RN made aware of mom declining baby screens at this time; will try again another time to get hearing screen and pku done

## 2019-11-03 NOTE — Progress Notes (Signed)
RNCM received consult for questions related to transportation and Medicaid. Met with MOB at bedside, FOB present along with baby in crib. MOB reports that she currently has insurance through her employer but is not planning to return to work there. MOB has questions about getting baby signed up for Medicaid. Discussed that as long as baby can be put on her insurance it is unclear if baby would qualify but that she can reach out to DSS by phone, online or in person to fill out application.  MOB reports that she didn't have issues with transportation that she does drive but her issue is that they have told her that she will need to bring the baby in 2 days after leaving the hospital for a check up and she is unsure if she will feel ok for this. Discussed with MOB that it is very important to keep follow up appointments and that she can likely get a family member to assist her. Patient was ok with this plan. No other needs identified at this time.

## 2019-11-03 NOTE — Progress Notes (Signed)
Progress Note - Cesarean Delivery  Sharon Norman is a 39 y.o. G1P1001 now PP day 2 s/p C-Section, Low Transverse .   Subjective:  Patient reports no problems with eating, voiding, or their wound, she denies passing gas and has not had a bowel movement.    Objective:  Vital signs in last 24 hours: Temp:  [98 F (36.7 C)-98.1 F (36.7 C)] 98.1 F (36.7 C) (08/02 2320) Pulse Rate:  [73-126] 104 (08/02 2320) Resp:  [18-20] 18 (08/02 2320) BP: (120-134)/(74-83) 130/74 (08/02 2320) SpO2:  [98 %-100 %] 99 % (08/02 2320)  Physical Exam:  General: alert, cooperative, appears stated age and fatigued Lochia: appropriate Uterine Fundus: firm Incision: no significant drainage    Data Review Recent Labs    10/31/19 0933 11/02/19 0625  HGB 12.2 8.5*  HCT 35.5* 25.2*    Assessment:  Active Problems:   Susceptible to varicella (non-immune), currently pregnant   Uterine fibroids affecting pregnancy in second trimester   Encounter for induction of labor   Type A blood, Rh positive   Sickle cell trait (HCC)   Abnormal glucose level     Overview: Elevated one hour, normal GTT   Advanced maternal age, primigravida   Post term pregnancy over 40 weeks   Status post primary low transverse cesarean section   Status post Cesarean section. Doing well postoperatively.     Plan:       Continue current care.  Request additional breastfeeding help , wants to stay until tomorrow.   Philip Aspen, CNM  11/03/2019 6:23 AM

## 2019-11-04 ENCOUNTER — Telehealth: Payer: Self-pay | Admitting: Certified Nurse Midwife

## 2019-11-04 DIAGNOSIS — O9081 Anemia of the puerperium: Secondary | ICD-10-CM | POA: Diagnosis not present

## 2019-11-04 MED ORDER — OXYCODONE HCL 10 MG PO TABS: 10.0000 mg | ORAL_TABLET | Freq: Four times a day (QID) | ORAL | 0 refills | Status: DC | PRN

## 2019-11-04 MED ORDER — SIMETHICONE 80 MG PO CHEW: 80.0000 mg | CHEWABLE_TABLET | ORAL | 0 refills | Status: DC

## 2019-11-04 MED ORDER — IBUPROFEN 800 MG PO TABS: 800.0000 mg | ORAL_TABLET | Freq: Four times a day (QID) | ORAL | 0 refills | Status: DC

## 2019-11-04 MED ORDER — GABAPENTIN 300 MG PO CAPS: 300.0000 mg | ORAL_CAPSULE | Freq: Two times a day (BID) | ORAL | 0 refills | Status: DC

## 2019-11-04 MED ORDER — ACETAMINOPHEN 325 MG PO TABS: 650.0000 mg | ORAL_TABLET | ORAL | 0 refills | Status: DC | PRN

## 2019-11-04 NOTE — Discharge Instructions (Signed)
Discharge Instructions:   Follow-up Appointment:   If there are any new medications, they have been ordered and will be available for pickup at the listed pharmacy on your way home from the hospital.   Call office if you have any of the following: headache, visual changes, fever >100.4 F, chills, shortness of breath, breast concerns, excessive vaginal bleeding, incision drainage or problems, leg pain or redness, depression or any other concerns. If you have vaginal discharge with an odor, let your doctor know.   It is normal to bleed for up to 6 weeks. You should not soak through more than 1 pad in 1 hour. If you have a blood clot larger than your fist with continued bleeding, call your doctor.   After a c-section, you should expect a small amount of blood or clear fluid coming from the incision and abdominal cramping/soreness. Inspect your incision site daily. Stand in front of a mirror to look for any redness, incision opening, or discolored/odorness drainage. Take a shower daily and continue good hygiene. Use own towel and washcloth (do not share). Make sure your sheets on your bed are clean. No pets sleeping around your incision site. Dressing will be removed at your postpartum visit. If the dressing does become wet or soiled underneath, it is okay to remove it.   Activity: Do not lift > 10 lbs for 6 weeks (do not lift anything heavier than your baby). No intercourse, tampons, swimming pools, hot tubs, baths (only showers) for 6 weeks.  No driving for 1-2 weeks. Continue prenatal vitamin, especially if breastfeeding. Increase calories and fluids (water) while breastfeeding.   Your milk will come in, in the next couple of days (right now it is colostrum). You may have a slight fever when your milk comes in, but it should go away on its own.  If it does not, and rises above 101 F please call the doctor. You will also feel achy and your breasts will be firm. They will also start to leak. If you  are breastfeeding, continue as you have been and you can pump/express milk for comfort.   If you have too much milk, your breasts can become engorged, which could lead to mastitis. This is an infection of the milk ducts. It can be very painful and you will need to notify your doctor to obtain a prescription for antibiotics. You can also treat it with a shower or hot/cold compress.   For concerns about your baby, please call your pediatrician.  For breastfeeding concerns, the lactation consultant can be reached at [phone].   Postpartum blues (feelings of happy one minute and sad another minute) are normal for the first few weeks but if it gets worse let your doctor know.   Congratulations! We enjoyed caring for you and your new bundle of joy!

## 2019-11-04 NOTE — Progress Notes (Signed)
Per discussion with pt, pt requests that RN cluster as many tasks tonight; "I just need to get some sleep"; RN and pt discussed tasks that need to get completed; pt agreed to call when breastfeeding so RN can get baby temp; RN also discussed pain meds and when the pt would like them brought to the room

## 2019-11-04 NOTE — Telephone Encounter (Signed)
Called pt Sharon Norman. Made appts  Pt can call in to change

## 2019-11-04 NOTE — Discharge Summary (Signed)
Obstetric Discharge Summary  Patient ID: Sharon Norman MRN: [id number] DOB/AGE: 1980/10/03 39 y.o.   Date of Admission: 10/31/2019  Date of Discharge:  11/04/19  Admitting Diagnosis: Induction of labor at [redacted]w[redacted]d  Secondary Diagnosis:   Patient Active Problem List   Diagnosis Date Noted  . Postpartum anemia 11/04/2019  . Post term pregnancy over 40 weeks   . Status post primary low transverse cesarean section   . Advanced maternal age, primigravida 11/01/2019  . Encounter for induction of labor 10/31/2019  . Type A blood, Rh positive 10/31/2019  . Sickle cell trait ([place]) 10/31/2019  . Abnormal glucose level 10/31/2019  . Carpal tunnel syndrome during pregnancy 07/09/2019  . Swelling of lower extremity during pregnancy in second trimester 07/09/2019  . Uterine fibroids affecting pregnancy in second trimester 06/05/2019  . LGSIL on Pap smear of cervix 05/23/2019  . Susceptible to varicella (non-immune), currently pregnant 05/22/2019    Mode of Delivery: repeat cesarean section- low uterine, transverse     Discharge Diagnosis: Reasons for cesarean section  Non-reassuring FHR and Failed induction of labor   Intrapartum Procedures: Atificial rupture of membranes, pitocin augmentation, placement of fetal scalp electrode, placement of intrauterine catheter and cytotec augmentation   Post partum procedures: None  Complications: Postpartum anemia due to blood loss, requiring iron supplementation   Brief Hospital Course   Karaline Davison is a G1P1001 who underwent cesarean section on 11/01/2019.  Patient had an uncomplicated surgery; for further details of this surgery, please refer to the operative note.  Patient had an uncomplicated postpartum course.  By time of discharge on POD#3/PPD#3, her pain was controlled on oral pain medications; she had appropriate lochia and was ambulating, voiding without difficulty, tolerating regular diet and passing flatus.   She was deemed stable for  discharge to home.    Labs: CBC Latest Ref Rng & Units 11/02/2019 10/31/2019 10/13/2019  WBC 4.0 - 10.5 K/uL 15.6(H) 9.8 10.5  Hemoglobin 12.0 - 15.0 g/dL 8.5(L) 12.2 12.2  Hematocrit 36 - 46 % 25.2(L) 35.5(L) 36.7  Platelets 150 - 400 K/uL 145(L) 188 220   A POS Performed at [facility], [address] [place], [place] [address]   Physical exam:   Temp:  [98 F (36.7 C)-98.6 F (37 C)] 98.6 F (37 C) (08/04 0756) Pulse Rate:  [95-115] 95 (08/04 0756) Resp:  [18-20] 18 (08/04 0756) BP: (123-132)/(74-89) 123/74 (08/04 0756) SpO2:  [98 %-100 %] 100 % (08/04 0756)  General: alert and no distress  Lochia: appropriate  Abdomen: soft, NT  Uterine Fundus: firm  Incision: healing well, no significant drainage, no dehiscence, no significant erythema  Extremities: No evidence of DVT seen on physical exam. No lower extremity edema.  Edinburgh Postnatal Depression Scale Screening Tool 11/02/2019  I have been able to laugh and see the funny side of things. 0  I have looked forward with enjoyment to things. 0  I have blamed myself unnecessarily when things went wrong. 2  I have been anxious or worried for no good reason. 0  I have felt scared or panicky for no good reason. 0  Things have been getting on top of me. 0  I have been so unhappy that I have had difficulty sleeping. 0  I have felt sad or miserable. 1  I have been so unhappy that I have been crying. 1  The thought of harming myself has occurred to me. 0  Edinburgh Postnatal Depression Scale Total 4     Discharge  Instructions: Per After Visit Summary.  Activity: Advance as tolerated. Pelvic rest for 6 weeks.  Also refer to After Visit Summary  Diet: Regular  Medications: Allergies as of 11/04/2019      Reactions   Latex Rash      Medication List    TAKE these medications   acetaminophen 325 MG tablet Commonly known as: TYLENOL Take 2 tablets (650 mg total) by mouth every 4 (four) hours as needed for  mild pain or moderate pain.   albuterol 108 (90 Base) MCG/ACT inhaler Commonly known as: VENTOLIN HFA   ferrous sulfate 325 (65 FE) MG tablet Take 325 mg by mouth daily with breakfast.   gabapentin 300 MG capsule Commonly known as: NEURONTIN Take 1 capsule (300 mg total) by mouth 2 (two) times daily.   ibuprofen 800 MG tablet Commonly known as: ADVIL Take 1 tablet (800 mg total) by mouth every 6 (six) hours.   Oxycodone HCl 10 MG Tabs Take 1 tablet (10 mg total) by mouth every 6 (six) hours as needed for moderate pain or severe pain.   prenatal multivitamin Tabs tablet Take 1 tablet by mouth daily at 12 noon.   simethicone 80 MG chewable tablet Commonly known as: MYLICON Chew 1 tablet (80 mg total) by mouth daily. Start taking on: November 05, 2019   traMADol 50 MG tablet Commonly known as: Ultram Take 1 tablet (50 mg total) by mouth every 6 (six) hours as needed.            Discharge Care Instructions  (From admission, onward)         Start     Ordered   11/04/19 0000  Leave dressing on - Keep it clean, dry, and intact until clinic visit        11/04/19 1008         Outpatient follow up:   Follow-up Information    Diona Fanti, CNM Follow up.   Specialties: Certified Nurse Midwife, Obstetrics and Gynecology, Radiology Why: Someone from the office will call you to schedule an incision check on Monday (11/09/2019) and a six (6) week postpartum visit Contact information: [address] [place] [address] [phone]              Postpartum contraception: abstinence  Discharged Condition: stable  Discharged to: home   Newborn Data:  Disposition:home with mother  Apgars: APGAR (1 MIN): 8   APGAR (5 MINS): 8    Baby Feeding: Breast   Dani Gobble, CNM  [facility], [facility] 11/04/19 10:10 AM

## 2019-11-04 NOTE — Progress Notes (Signed)
Pt to be discharged with infant. Discharge instructions, prescriptions, and follow up appointments given to and reviewed with patient. Surgery kit given and instructions on how to care for incision given. Pt verbalized understanding. To be escorted out by auxillary.

## 2019-11-09 ENCOUNTER — Telehealth: Payer: Self-pay | Admitting: Certified Nurse Midwife

## 2019-11-09 ENCOUNTER — Encounter: Payer: Commercial Managed Care - PPO | Admitting: Certified Nurse Midwife

## 2019-11-09 NOTE — Telephone Encounter (Signed)
Called patient was sent to VM, informed patient to call the office.

## 2019-11-09 NOTE — Telephone Encounter (Signed)
Please contact patient, may leave dressing intact until visit on Friday. Thanks, Dani Gobble, CNM

## 2019-11-09 NOTE — Telephone Encounter (Signed)
Patient called in stating her car wont start that she would need to reschedule. Informed her I could place her on for Friday at 11:15 am. Patient stated she could come in on Friday but had concerns on if she should leave her dressing on that long. Informed her that I would send a message to her provider and she would advise on what she should do.  Could you please advise?

## 2019-11-13 ENCOUNTER — Ambulatory Visit (INDEPENDENT_AMBULATORY_CARE_PROVIDER_SITE_OTHER): Payer: Commercial Managed Care - PPO | Admitting: Certified Nurse Midwife

## 2019-11-13 ENCOUNTER — Other Ambulatory Visit: Payer: Self-pay

## 2019-11-13 ENCOUNTER — Encounter: Payer: Self-pay | Admitting: Certified Nurse Midwife

## 2019-11-13 VITALS — BP 120/80 | HR 84 | Ht 63.0 in | Wt 149.8 lb

## 2019-11-13 DIAGNOSIS — Z5189 Encounter for other specified aftercare: Secondary | ICD-10-CM

## 2019-11-13 MED ORDER — IBUPROFEN 800 MG PO TABS: 800.0000 mg | ORAL_TABLET | Freq: Four times a day (QID) | ORAL | 0 refills | Status: DC

## 2019-11-13 MED ORDER — OXYCODONE HCL 10 MG PO TABS: 10.0000 mg | ORAL_TABLET | Freq: Four times a day (QID) | ORAL | 0 refills | Status: DC | PRN

## 2019-11-13 MED ORDER — GABAPENTIN 300 MG PO CAPS: 300.0000 mg | ORAL_CAPSULE | Freq: Two times a day (BID) | ORAL | 0 refills | Status: DC

## 2019-11-13 NOTE — Patient Instructions (Signed)
Cesarean Delivery, Care After This sheet gives you information about how to care for yourself after your procedure. Your health care provider may also give you more specific instructions. If you have problems or questions, contact your health care provider. What can I expect after the procedure? After the procedure, it is common to have:  A small amount of blood or clear fluid coming from the incision.  Some redness, swelling, and pain in your incision area.  Some abdominal pain and soreness.  Vaginal bleeding (lochia). Even though you did not have a vaginal delivery, you will still have vaginal bleeding and discharge.  Pelvic cramps.  Fatigue. You may have pain, swelling, and discomfort in the tissue between your vagina and your anus (perineum) if:  Your C-section was unplanned, and you were allowed to labor and push.  An incision was made in the area (episiotomy) or the tissue tore during attempted vaginal delivery. Follow these instructions at home: Incision care   Follow instructions from your health care provider about how to take care of your incision. Make sure you: ? Wash your hands with soap and water before you change your bandage (dressing). If soap and water are not available, use hand sanitizer. ? If you have a dressing, change it or remove it as told by your health care provider. ? Leave stitches (sutures), skin staples, skin glue, or adhesive strips in place. These skin closures may need to stay in place for 2 weeks or longer. If adhesive strip edges start to loosen and curl up, you may trim the loose edges. Do not remove adhesive strips completely unless your health care provider tells you to do that.  Check your incision area every day for signs of infection. Check for: ? More redness, swelling, or pain. ? More fluid or blood. ? Warmth. ? Pus or a bad smell.  Do not take baths, swim, or use a hot tub until your health care provider says it's okay. Ask your health  care provider if you can take showers.  When you cough or sneeze, hug a pillow. This helps with pain and decreases the chance of your incision opening up (dehiscing). Do this until your incision heals. Medicines  Take over-the-counter and prescription medicines only as told by your health care provider.  If you were prescribed an antibiotic medicine, take it as told by your health care provider. Do not stop taking the antibiotic even if you start to feel better.  Do not drive or use heavy machinery while taking prescription pain medicine. Lifestyle  Do not drink alcohol. This is especially important if you are breastfeeding or taking pain medicine.  Do not use any products that contain nicotine or tobacco, such as cigarettes, e-cigarettes, and chewing tobacco. If you need help quitting, ask your health care provider. Eating and drinking  Drink at least 8 eight-ounce glasses of water every day unless told not to by your health care provider. If you breastfeed, you may need to drink even more water.  Eat high-fiber foods every day. These foods may help prevent or relieve constipation. High-fiber foods include: ? Whole grain cereals and breads. ? Brown rice. ? Beans. ? Fresh fruits and vegetables. Activity   If possible, have someone help you care for your baby and help with household activities for at least a few days after you leave the hospital.  Return to your normal activities as told by your health care provider. Ask your health care provider what activities are safe for   you.  Rest as much as possible. Try to rest or take a nap while your baby is sleeping.  Do not lift anything that is heavier than 10 lbs (4.5 kg), or the limit that you were told, until your health care provider says that it is safe.  Talk with your health care provider about when you can engage in sexual activity. This may depend on your: ? Risk of infection. ? How fast you heal. ? Comfort and desire to  engage in sexual activity. General instructions  Do not use tampons or douches until your health care provider approves.  Wear loose, comfortable clothing and a supportive and well-fitting bra.  Keep your perineum clean and dry. Wipe from front to back when you use the toilet.  If you pass a blood clot, save it and call your health care provider to discuss. Do not flush blood clots down the toilet before you get instructions from your health care provider.  Keep all follow-up visits for you and your baby as told by your health care provider. This is important. Contact a health care provider if:  You have: ? A fever. ? Bad-smelling vaginal discharge. ? Pus or a bad smell coming from your incision. ? Difficulty or pain when urinating. ? A sudden increase or decrease in the frequency of your bowel movements. ? More redness, swelling, or pain around your incision. ? More fluid or blood coming from your incision. ? A rash. ? Nausea. ? Little or no interest in activities you used to enjoy. ? Questions about caring for yourself or your baby.  Your incision feels warm to the touch.  Your breasts turn red or become painful or hard.  You feel unusually sad or worried.  You vomit.  You pass a blood clot from your vagina.  You urinate more than usual.  You are dizzy or light-headed. Get help right away if:  You have: ? Pain that does not go away or get better with medicine. ? Chest pain. ? Difficulty breathing. ? Blurred vision or spots in your vision. ? Thoughts about hurting yourself or your baby. ? New pain in your abdomen or in one of your legs. ? A severe headache.  You faint.  You bleed from your vagina so much that you fill more than one sanitary pad in one hour. Bleeding should not be heavier than your heaviest period. Summary  After the procedure, it is common to have pain at your incision site, abdominal cramping, and slight bleeding from your vagina.  Check  your incision area every day for signs of infection.  Tell your health care provider about any unusual symptoms.  Keep all follow-up visits for you and your baby as told by your health care provider. This information is not intended to replace advice given to you by your health care provider. Make sure you discuss any questions you have with your health care provider. Document Revised: 09/25/2017 Document Reviewed: 09/25/2017 Elsevier Patient Education  2020 [employer]  

## 2019-11-13 NOTE — Progress Notes (Signed)
    OBSTETRICS/GYNECOLOGY POST-OPERATIVE CLINIC VISIT  Subjective:     Sharon Norman is a 39 y.o. female who presents to the clinic 1 weeks status post primary cesarean section for failed induction; non reassuring fetal heart tones remote from birth.   Eating a regular diet without difficulty. Bowel movements are normal. Pain is not well controlled.  Medications being used: acetaminophen, ibuprofen (OTC) and narcotic analgesics including oxycodone (Oxycontin, Oxyir).   Denies difficulty breathing or respiratory distress, chest pain, dysuria, excessive vaginal bleeding, and leg pain or swelling.   The following portions of the patient's history were reviewed and updated as appropriate: allergies, current medications, past family history, past medical history, past social history, past surgical history and problem list.  Review of Systems  Pertinent items are noted in HPI.   Objective:    BP 120/80   Pulse 84   Ht 5\' 3"  (1.6 m)   Wt 149 lb 12.8 oz (67.9 kg)   Breastfeeding Yes   BMI 26.54 kg/m    General:  alert and no distress  Abdomen: soft, bowel sounds active, non-tender  Incision:   no drainage, no erythema, no hernia, no seroma, no swelling, mostly approximated    Assessment:    Postoperative course complicated by pain and slow healing incision site   Plan:   Continue any current medications. Refills provided, see orders.   Wound care discussed included home treatment measures. Steri strips replaced during visit.   Activity restrictions: no lifting more than 10 pounds.  Anticipated return to work: not applicable.  Follow up: 1 weeks for repeat incision check or sooner if needed.    Diona Fanti, CNM [facility], [facility] 11/13/19 5:02 PM

## 2019-11-20 ENCOUNTER — Other Ambulatory Visit: Payer: Self-pay

## 2019-11-20 ENCOUNTER — Encounter: Payer: Self-pay | Admitting: Certified Nurse Midwife

## 2019-11-20 ENCOUNTER — Ambulatory Visit (INDEPENDENT_AMBULATORY_CARE_PROVIDER_SITE_OTHER): Payer: Commercial Managed Care - PPO | Admitting: Certified Nurse Midwife

## 2019-11-20 VITALS — BP 126/88 | HR 93 | Ht 63.0 in | Wt 150.3 lb

## 2019-11-20 DIAGNOSIS — Z5189 Encounter for other specified aftercare: Secondary | ICD-10-CM

## 2019-11-20 DIAGNOSIS — Z98891 History of uterine scar from previous surgery: Secondary | ICD-10-CM

## 2019-11-20 NOTE — Progress Notes (Signed)
    OBSTETRICS/GYNECOLOGY POST-OPERATIVE CLINIC VISIT  Subjective:     Sharon Norman is a 39 y.o. female who presents to the clinic 2 weeks status post primary cesarean section for failed induction; non reassuring fetal heart tones remote from birth.   Eating a regular diet without difficulty. Bowel movements are normal. Pain is controlled with current analgesics. Medications being used: ibuprofen (OTC), narcotic analgesics including oxycodone and gabapentin.   Denies difficulty breathing or respiratory distress, chest pain, dysuria, excessive vaginal bleeding, and leg pain or swelling.   The following portions of the patient's history were reviewed and updated as appropriate: allergies, current medications, past family history, past medical history, past social history, past surgical history and problem list.  Review of Systems  Pertinent items are noted in HPI.   Objective:    BP 126/88   Pulse 93   Ht 5\' 3"  (1.6 m)   Wt 150 lb 4.8 oz (68.2 kg)   Breastfeeding Yes   BMI 26.62 kg/m    General:  alert and no distress  Abdomen: soft, bowel sounds active, non-tender  Incision:   no drainage, no erythema, no hernia, no seroma, no swelling, mostly approximated    Assessment:    Postoperative course complicated by pain and slow healing incision site   Plan:   Continue any current medications.   Wound care discussed included home treatment measures. Steri strips replaced during visit. Advised patient to remove in 7-10 days or allow to "fall off".   Activity restrictions: no lifting more than 10 pounds.  Anticipated return to work: not applicable.  RTC as previously scheduled or sooner if needed.    Diona Fanti, CNM [facility], [facility] 11/20/19 4:58 PM

## 2019-11-20 NOTE — Patient Instructions (Signed)

## 2019-12-17 ENCOUNTER — Encounter: Payer: Commercial Managed Care - PPO | Admitting: Certified Nurse Midwife

## 2019-12-22 ENCOUNTER — Ambulatory Visit (INDEPENDENT_AMBULATORY_CARE_PROVIDER_SITE_OTHER): Payer: Commercial Managed Care - PPO | Admitting: Certified Nurse Midwife

## 2019-12-22 ENCOUNTER — Other Ambulatory Visit: Payer: Self-pay

## 2019-12-22 ENCOUNTER — Encounter: Payer: Self-pay | Admitting: Certified Nurse Midwife

## 2019-12-22 ENCOUNTER — Ambulatory Visit: Payer: Self-pay

## 2019-12-22 DIAGNOSIS — Z1331 Encounter for screening for depression: Secondary | ICD-10-CM

## 2019-12-22 NOTE — Lactation Note (Signed)
This note was copied from a baby's chart. Lactation Consultation Note  Patient Name: Sharon Norman [id number] Date: 12/22/2019 Reason for consult: Follow-up assessment (slow weight gain/outpatient)  Mom and baby present for outpatient feeding assessment. Mom reports discharge weight: 7lb4oz, 1st doc appt 7lb0oz, and 1 month wellness 7lb10oz.  Start weight today: 3584g (7lb14.4oz)  Maternal Data Formula Feeding for Exclusion: No Has patient been taught Hand Expression?: Yes Does the patient have breastfeeding experience prior to this delivery?: No  Mom is AMA, first time mom who is exclusively breastfeeding her 33 week old. Mom is worried about low/poor weight gain and desires a feeding evaluation. Mom provides background information: alarm set for feedings every 2-3 hours, baby at times only feeding from one breast per feed, wet/stool diapers around or exceed 8 per day. Mom was given a nipple shield while in the hospital and has continued to use it.  Feeding Feeding Type: Breast Fed  Pre-feed: 9983 @ 1308 Post-feed: 3825 @1325  (70mL transfer) Post-feed: 3662 @ 1337 Total transfer at breast: 11mL Total expressed via [place]: 49mL Total consumed via bottle: 14mL Total feeding transfer (breast/bottle): 38mL  LATCH Score Latch: Grasps breast easily, tongue down, lips flanged, rhythmical sucking.  Audible Swallowing: Spontaneous and intermittent  Type of Nipple: Everted at rest and after stimulation  Comfort (Breast/Nipple): Soft / non-tender  Hold (Positioning): Assistance needed to correctly position infant at breast and maintain latch. (added support pillow)  LATCH Score: 9  Interventions Interventions: Breast feeding basics reviewed;Hand express;Support pillows;Hand pump;DEBP  Lactation Tools Discussed/Used Tools: Nipple Shields Nipple shield size: 20 Pump Review: Setup, frequency, and cleaning;Milk Storage   Baby successfully transferred an appropriate amount, and  accepted the bottle of expressed milk well.  Mom has been given a feeding plan to help encourage continued adequate intake and efforts to help build her supply. Feeding Plan:  -Feed from both breasts + any expressed milk from [facility] every 2-3 hours -Pump and/or hand express during current 5hr window overnight, capitalizing on 2am-5am window, and/or wake baby to feed. Financial controller insurance for DEBP -Once DEBP has arrived, begin pumping post-feeds 2-3x/day, for now hand express/use haaka post feeds 2-3x/day.  Return for outpatient follow-up/weight check on 9/28 @ 1pm.   Consult Status Consult Status: Complete    Lavonia Drafts 12/22/2019, 2:15 PM

## 2019-12-22 NOTE — Patient Instructions (Signed)
Colposcopy Colposcopy is a procedure to examine the lowest part of the uterus (cervix), the vagina, and the area around the vaginal opening (vulva) for abnormalities or signs of disease. The procedure is done using a lighted microscope or magnifying lens (colposcope). If any unusual cells are found during the procedure, your health care provider may remove a tissue sample for testing (biopsy). A colposcopy may be done if you:  Have an abnormal Pap test. A Pap test is a screening test that is used to check for signs of cancer or infection of the vagina, cervix, and uterus.  Have a Pap smear test in which you test positive for high-risk HPV (human papillomavirus).  Have a sore or lesion on your cervix.  Have genital warts on your vulva, vagina, or cervix.  Took certain medicines while pregnant, such as diethylstilbestrol (DES).  Have pain during sexual intercourse.  Have vaginal bleeding, especially after sexual intercourse.  Need to have a cervical polyp removed.  Need to have a lost intrauterine device (IUD) string located. Let your health care provider know about:  Any allergies you have, including allergies to prescribed medicine, latex, or iodine.  All medicines you are taking, including vitamins, herbs, eye drops, creams, and over-the-counter medicines. Bring a list of all of your medicines to your appointment.  Any problems you or family members have had with anesthetic medicines.  Any blood disorders you have.  Any surgeries you have had.  Any medical conditions you have, such as pelvic inflammatory disease (PID) or endometrial disorder.  Any history of frequent fainting.  Your menstrual cycle and what form of birth control (contraception) you use.  Your medical history, including any prior cervical treatment.  Whether you are pregnant or may be pregnant. What are the risks? Generally, this is a safe procedure. However, problems may occur,  including:  Pain.  Infection, which may include a fever, bad-smelling discharge, or pelvic pain.  Bleeding or discharge.  Misdiagnosis.  Fainting and vasovagal reactions, but this is rare.  Allergic reactions to medicines.  Damage to other structures or organs. What happens before the procedure?  If you have your menstrual period or will have it at the time of your procedure, tell your health care provider. A colposcopy typically is not done during menstruation.  Continue your contraceptive practices before and after the procedure.  For 24 hours before the colposcopy: ? Do not douche. ? Do not use tampons. ? Do not use medicines, creams, or suppositories in the vagina. ? Do not have sexual intercourse.  Ask your health care provider about: ? Changing or stopping your regular medicines. This is especially important if you are taking diabetes medicines or blood thinners. ? Taking medicines such as aspirin and ibuprofen. These medicines can thin your blood. Do not take these medicines before your procedure if your health care provider instructs you not to. It is likely that your health care provider will tell you to avoid taking aspirin or medicine that contains aspirin for 7 days before the procedure.  Follow instructions from your health care provider about eating or drinking restrictions. You will likely need to eat a regular diet the day of the procedure and not skip any meals.  You may have an exam or testing. A pregnancy test will be taken on the day of the procedure.  You may have a blood or urine sample taken.  Plan to have someone take you home from the hospital or clinic.  If you will be going  home right after the procedure, plan to have someone with you for 24 hours. What happens during the procedure?  You will lie down on your back, with your feet in foot rests (stirrups).  A warmed and lubricated instrument (speculum) will be inserted into your vagina. The  speculum will be used to hold apart the walls of your vagina so your health care provider can see your cervix and the inside of your vagina.  A cotton swab will be used to place a small amount of liquid solution on the areas to be examined. This solution makes it easier to see abnormal cells. You may feel a slight burning during this part.  The colposcope will be used to scan the cervix with a bright white light. The colposcope will be held near your vulvaand will magnify your vulva, vagina, and cervix for easier examination.  Your health care provider may decide to take a biopsy. If so: ? You may be given medicine to numb the area (local anesthetic). ? Surgical instruments will be used to suck out mucus and cells through your vagina. ? You may feel mild pain while the tissue sample is removed. ? Bleeding may occur. A solution may be used to stop the bleeding. ? If a sample of tissue is needed from the inside of the cervix, a different procedure called endocervical curettage (ECC) may be completed. During this procedure, a curved instrument (curette) will be used to scrape cells from your cervix or the top of your cervix (endocervix).  Your health care provider will record the location of any abnormalities. The procedure may vary among health care providers and hospitals. What happens after the procedure?  You will lie down and rest for a few minutes. You may be offered juice or cookies.  Your blood pressure, heart rate, breathing rate, and blood oxygen level will be monitored until any medicines you were given have worn off.  You may have to wear compression stockings. These stockings help to prevent blood clots and reduce swelling in your legs.  You may have some cramping in your abdomen. This should go away after a few minutes. This information is not intended to replace advice given to you by your health care provider. Make sure you discuss any questions you have with your health care  provider. Document Revised: 03/01/2017 Document Reviewed: 10/24/2015 Elsevier Patient Education  2020 [place] 57-69 Years Old, Female Preventive care refers to visits with your health care provider and lifestyle choices that can promote health and wellness. This includes:  A yearly physical exam. This may also be called an annual well check.  Regular dental visits and eye exams.  Immunizations.  Screening for certain conditions.  Healthy lifestyle choices, such as eating a healthy diet, getting regular exercise, not using drugs or products that contain nicotine and tobacco, and limiting alcohol use. What can I expect for my preventive care visit? Physical exam Your health care provider will check your:  Height and weight. This may be used to calculate body mass index (BMI), which tells if you are at a healthy weight.  Heart rate and blood pressure.  Skin for abnormal spots. Counseling Your health care provider may ask you questions about your:  Alcohol, tobacco, and drug use.  Emotional well-being.  Home and relationship well-being.  Sexual activity.  Eating habits.  Work and work Statistician.  Method of birth control.  Menstrual cycle.  Pregnancy history. What immunizations do I need?  Influenza (flu) vaccine  This is recommended every year. Tetanus, diphtheria, and pertussis (Tdap) vaccine  You may need a Td booster every 10 years. Varicella (chickenpox) vaccine  You may need this if you have not been vaccinated. Human papillomavirus (HPV) vaccine  If recommended by your health care provider, you may need three doses over 6 months. Measles, mumps, and rubella (MMR) vaccine  You may need at least one dose of MMR. You may also need a second dose. Meningococcal conjugate (MenACWY) vaccine  One dose is recommended if you are age 28-21 years and a first-year college student living in a residence hall, or if you have one of  several medical conditions. You may also need additional booster doses. Pneumococcal conjugate (PCV13) vaccine  You may need this if you have certain conditions and were not previously vaccinated. Pneumococcal polysaccharide (PPSV23) vaccine  You may need one or two doses if you smoke cigarettes or if you have certain conditions. Hepatitis A vaccine  You may need this if you have certain conditions or if you travel or work in places where you may be exposed to hepatitis A. Hepatitis B vaccine  You may need this if you have certain conditions or if you travel or work in places where you may be exposed to hepatitis B. Haemophilus influenzae type b (Hib) vaccine  You may need this if you have certain conditions. You may receive vaccines as individual doses or as more than one vaccine together in one shot (combination vaccines). Talk with your health care provider about the risks and benefits of combination vaccines. What tests do I need?  Blood tests  Lipid and cholesterol levels. These may be checked every 5 years starting at age 100.  Hepatitis C test.  Hepatitis B test. Screening  Diabetes screening. This is done by checking your blood sugar (glucose) after you have not eaten for a while (fasting).  Sexually transmitted disease (STD) testing.  BRCA-related cancer screening. This may be done if you have a family history of breast, ovarian, tubal, or peritoneal cancers.  Pelvic exam and Pap test. This may be done every 3 years starting at age 27. Starting at age 67, this may be done every 5 years if you have a Pap test in combination with an HPV test. Talk with your health care provider about your test results, treatment options, and if necessary, the need for more tests. Follow these instructions at home: Eating and drinking   Eat a diet that includes fresh fruits and vegetables, whole grains, lean protein, and low-fat dairy.  Take vitamin and mineral supplements as  recommended by your health care provider.  Do not drink alcohol if: ? Your health care provider tells you not to drink. ? You are pregnant, may be pregnant, or are planning to become pregnant.  If you drink alcohol: ? Limit how much you have to 0-1 drink a day. ? Be aware of how much alcohol is in your drink. In the [place] one drink equals one 12 oz bottle of beer (355 mL), one 5 oz glass of wine (148 mL), or one 1 oz glass of hard liquor (44 mL). Lifestyle  Take daily care of your teeth and gums.  Stay active. Exercise for at least 30 minutes on 5 or more days each week.  Do not use any products that contain nicotine or tobacco, such as cigarettes, e-cigarettes, and chewing tobacco. If you need help quitting, ask your health care provider.  If you are sexually  active, practice safe sex. Use a condom or other form of birth control (contraception) in order to prevent pregnancy and STIs (sexually transmitted infections). If you plan to become pregnant, see your health care provider for a preconception visit. What's next?  Visit your health care provider once a year for a well check visit.  Ask your health care provider how often you should have your eyes and teeth checked.  Stay up to date on all vaccines. This information is not intended to replace advice given to you by your health care provider. Make sure you discuss any questions you have with your health care provider. Document Revised: 11/28/2017 Document Reviewed: 11/28/2017 Elsevier Patient Education  2020 [employer].

## 2019-12-22 NOTE — Progress Notes (Signed)
Subjective:    Sharon Norman is a 39 y.o. G76P1001 African American female who presents for a postpartum visit. She is 6 weeks postpartum following a primary cesarean section, low transverse incision at 40+4 gestational weeks. Anesthesia: epidural and spinal. I have fully reviewed the prenatal and intrapartum course.   Postpartum course has been uncomplicated except for slow healing surgical wound.   Baby's course has been uncomplicated except for slow weight gain. Baby is feeding by breast.   Bleeding no bleeding. Bowel function is normal. Bladder function is normal.   Patient is not sexually active. Contraception method is abstinence.   Postpartum depression screening: negative. Score 2.  Last pap 04/2019 and was LSIL.  Denies difficulty breathing or respiratory distress, chest pain, abdominal pain, excessive vaginal bleeding, dysuria, and leg pain.   The following portions of the patient's history were reviewed and updated as appropriate: allergies, current medications, past medical history, past surgical history and problem list.  Review of Systems  Pertinent items are noted in HPI.   Objective:   BP 131/88   Pulse 81   Ht 5\' 3"  (1.6 m)   Wt 144 lb (65.3 kg)   Breastfeeding Yes   BMI 25.51 kg/m   General:  alert, cooperative and no distress   Breasts:  deferred, no complaints  Lungs: clear to auscultation bilaterally  Heart:  regular rate and rhythm  Abdomen: soft, non-tender, incision healed        Depression screen [facility] 2/9 12/22/2019  Decreased Interest 0  Down, Depressed, Hopeless 1  PHQ - 2 Score 1  Altered sleeping 0  Tired, decreased energy 1  Change in appetite 0  Feeling bad or failure about yourself  0  Trouble concentrating 0  Moving slowly or fidgety/restless 0  Suicidal thoughts 0  PHQ-9 Score 2  Difficult doing work/chores Not difficult at all   GAD 7 : Generalized Anxiety Score 12/22/2019  Nervous, Anxious, on Edge 1  Control/stop worrying 0   Worry too much - different things 0  Trouble relaxing 0  Restless 0  Easily annoyed or irritable 0  Afraid - awful might happen 0  Total GAD 7 Score 1   Assessment:   Postpartum exam Six (6) wks s/p primary cesarean section Breastfeeding Depression screening Contraception counseling   Plan:   Encouraged routine health maintenance techniques.   Visit scheduled with lactation consultant later today.   Reviewed red flag symptoms and when to call.   Follow up in: 3 months for colposcopy or earlier if needed.    Dani Gobble, CNM [facility], [facility] 12/22/19 4:50 PM

## 2019-12-29 ENCOUNTER — Ambulatory Visit: Payer: Self-pay

## 2019-12-29 NOTE — Lactation Note (Signed)
This note was copied from a baby's chart. Lactation Consultation Note  Patient Name: Sharon Norman [id number] Date: 12/29/2019 Reason for consult: Follow-up assessment  Follow-up outpatient appointment for mom and baby. Weight check. Last weight on 9/21 was 7lb14.4oz  Maternal Data Formula Feeding for Exclusion: No Has patient been taught Hand Expression?: Yes Does the patient have breastfeeding experience prior to this delivery?: No   Mom has received her EBP through insurance, [employer], and ordered smaller flanges. She began to use in early morning hours (2am, and 6am) within the last 2 days. Feeding baby 9-10x/day and providing any captured via Haaka during feeding after.  Feeding Feeding Type: Breast Fed  Pre-feed: 3758g @ 1310(8lb4.4oz; weight gain of 6oz since last visit) Post feed: 3816 @ 1333 Total Transfer at one side: 40mL  Mom planned to use DEBP on other breast once she returned home (lives within 8 mins of hospital). Baby was content after coming off the breast, and mom let LC know that she had given 1oz pre-pumped breastmilk around 12 (prior to appointment) as baby was showing cues.  LATCH Score Latch: Grasps breast easily, tongue down, lips flanged, rhythmical sucking.  Audible Swallowing: Spontaneous and intermittent  Type of Nipple: Everted at rest and after stimulation  Comfort (Breast/Nipple): Soft / non-tender  Hold (Positioning): Assistance needed to correctly position infant at breast and maintain latch. (turned baby in )  [facility] Score: 9  Interventions Interventions: Breast feeding basics reviewed;Adjust position;Support pillows  Lactation Tools Discussed/Used Tools: Nipple Shields Nipple shield size: 20   Feeding Plan: -Continue to offer both breast at each feeding, using Haaka to capture letdown/leakage from opposite breasts -Provide all expressed into Haaka post feedings via bottle -Begin use of DEBP regularly, 3-4x/day, post feedings to  help initialize building of supply in anticipation of returning to work.  Pedatrician appointment next Tuesday (in 1 week), will update LC post appointment if additional outpatient appointments are needed.   Consult Status Consult Status: Complete    Lavonia Drafts 12/29/2019, 1:49 PM

## 2020-03-24 ENCOUNTER — Encounter: Payer: Commercial Managed Care - PPO | Admitting: Certified Nurse Midwife

## 2020-12-14 ENCOUNTER — Other Ambulatory Visit: Payer: Self-pay | Admitting: Family Medicine

## 2020-12-14 DIAGNOSIS — Z3201 Encounter for pregnancy test, result positive: Secondary | ICD-10-CM

## 2020-12-23 ENCOUNTER — Other Ambulatory Visit: Payer: Self-pay

## 2020-12-23 ENCOUNTER — Ambulatory Visit
Admission: RE | Admit: 2020-12-23 | Discharge: 2020-12-23 | Disposition: A | Discharge status: Home / Self Care | Payer: No Typology Code available for payment source | Source: Ambulatory Visit | Attending: Family Medicine | Admitting: Family Medicine

## 2020-12-23 DIAGNOSIS — Z3201 Encounter for pregnancy test, result positive: Secondary | ICD-10-CM | POA: Insufficient documentation

## 2020-12-26 ENCOUNTER — Telehealth: Payer: Self-pay | Admitting: Obstetrics and Gynecology

## 2020-12-26 NOTE — Telephone Encounter (Signed)
Sharon Norman called in and states she is really concerned about her ultrasound.  She stated no one told her anything but she saw on her mychart that there was no cardiac activity.  She did set up an appointment to see Deneise Lever for 9/27 but she wanted me to send a message to you and see if everything was ok.  Please advise.

## 2020-12-26 NOTE — Telephone Encounter (Signed)
The results weren't conveyed until after hours on Friday and for some reason came to me even though I was out of town.  I had already sent a message on Friday to get her scheduled with either myself or Deneise Lever.  Please let her know that her results will be discussed in detail at that time.

## 2020-12-27 ENCOUNTER — Other Ambulatory Visit: Payer: Self-pay

## 2020-12-27 ENCOUNTER — Ambulatory Visit (INDEPENDENT_AMBULATORY_CARE_PROVIDER_SITE_OTHER): Payer: No Typology Code available for payment source | Admitting: Certified Nurse Midwife

## 2020-12-27 ENCOUNTER — Other Ambulatory Visit: Payer: Self-pay | Admitting: Certified Nurse Midwife

## 2020-12-27 ENCOUNTER — Other Ambulatory Visit (INDEPENDENT_AMBULATORY_CARE_PROVIDER_SITE_OTHER): Payer: No Typology Code available for payment source

## 2020-12-27 ENCOUNTER — Encounter: Payer: Self-pay | Admitting: Certified Nurse Midwife

## 2020-12-27 VITALS — BP 161/79 | HR 111 | Ht 63.0 in | Wt 130.1 lb

## 2020-12-27 DIAGNOSIS — O200 Threatened abortion: Secondary | ICD-10-CM

## 2020-12-27 NOTE — Progress Notes (Signed)
GYN ENCOUNTER NOTE  Subjective:       Sharon Norman is a 40 y.o. G31P1001 female is here for gynecologic evaluation of the following issues:  1. Follow up from u/s 12/23/20. Pt state she had a positive pregnancy test and was sent for u/s by her pcp. U/s showed yolk sac and fetal pole measuring 8 wks 2 days with no cardiac activity.    Gynecologic History No LMP recorded. (Menstrual status: Lactating). Contraception: none Last Pap: 04/22/19. Results were: abnormal. LSIL /stypical glandular cells HPV neg.  Last mammogram: n/a .  Obstetric History OB History  Gravida Para Term Preterm AB Living  1 1 1     1   SAB IAB Ectopic Multiple Live Births        0 1    # Outcome Date GA Lbr Len/2nd Weight Sex Delivery Anes PTL Lv  1 Term 11/01/19 [redacted]w[redacted]d  7 lb 11.1 oz (3.49 kg) F CS-LTranv EPI  LIV    Past Medical History:  Diagnosis Date   Anemia    Asthma    Fibroid    Sickle cell trait ([place])     Past Surgical History:  Procedure Laterality Date   CESAREAN SECTION     left ovary removed     WISDOM TOOTH EXTRACTION      Current Outpatient Medications on File Prior to Visit  Medication Sig Dispense Refill   albuterol (VENTOLIN HFA) 108 (90 Base) MCG/ACT inhaler      ferrous sulfate 325 (65 FE) MG tablet Take 325 mg by mouth daily with breakfast.      Prenatal Vit-Fe Fumarate-FA (PRENATAL 19 PO) Take by mouth.     acetaminophen (TYLENOL) 325 MG tablet Take 2 tablets (650 mg total) by mouth every 4 (four) hours as needed for mild pain or moderate pain. 60 tablet 0   gabapentin (NEURONTIN) 300 MG capsule Take 1 capsule (300 mg total) by mouth 2 (two) times daily. 40 capsule 0   ibuprofen (ADVIL) 800 MG tablet Take 1 tablet (800 mg total) by mouth every 6 (six) hours. 60 tablet 0   No current facility-administered medications on file prior to visit.    Allergies  Allergen Reactions   Latex Rash    Social History   Socioeconomic History   Marital status: Married    Spouse name:  Not on file   Number of children: Not on file   Years of education: Not on file   Highest education level: Not on file  Occupational History   Not on file  Tobacco Use   Smoking status: Never   Smokeless tobacco: Never  Vaping Use   Vaping Use: Never used  Substance and Sexual Activity   Alcohol use: Not Currently   Drug use: Not Currently   Sexual activity: Not Currently    Birth control/protection: Abstinence  Other Topics Concern   Not on file  Social History Narrative   Not on file   Social Determinants of Health   Financial Resource Strain: Not on file  Food Insecurity: Not on file  Transportation Needs: Not on file  Physical Activity: Not on file  Stress: Not on file  Social Connections: Not on file  Intimate Partner Violence: Not on file    Family History  Problem Relation Age of Onset   Sickle cell anemia Mother    Cataracts Mother    Diabetes Mother    Healthy Father    Breast cancer Paternal Aunt    Hypertension Maternal  Grandmother    Hypertension Maternal Grandfather    Sickle cell trait Daughter     The following portions of the patient's history were reviewed and updated as appropriate: allergies, current medications, past family history, past medical history, past social history, past surgical history and problem list.  Review of Systems Review of Systems - Negative except as mentioned in HPI Review of Systems - General ROS: negative for - chills, fatigue, fever, hot flashes, malaise or night sweats Hematological and Lymphatic ROS: negative for - bleeding problems or swollen lymph nodes Gastrointestinal ROS: negative for - abdominal pain, blood in stools, change in bowel habits and nausea/vomiting Musculoskeletal ROS: negative for - joint pain, muscle pain or muscular weakness Genito-Urinary ROS: negative for - change in menstrual cycle, dysmenorrhea, dyspareunia, dysuria, genital discharge, genital ulcers, hematuria, incontinence, irregular/heavy  menses, nocturia or pelvic pain  Objective:   BP (!) 161/79   Pulse (!) 111   Ht 5\' 3"  (1.6 m)   Wt 130 lb 1.6 oz (59 kg)   Breastfeeding Yes   BMI 23.05 kg/m  CONSTITUTIONAL: Well-developed, well-nourished female in no acute distress.  HENT:  Normocephalic, atraumatic.  NECK: Normal range of motion, supple, no masses.  Normal thyroid.  SKIN: Skin is warm and dry. No rash noted. Not diaphoretic. No erythema. No pallor. Bolt: Alert and oriented to person, place, and time. PSYCHIATRIC: Normal mood and affect. Normal behavior. Normal judgment and thought content. CARDIOVASCULAR:Not Examined RESPIRATORY: Not Examined BREASTS: Not Examined ABDOMEN: Soft, non distended; Non tender.  No Organomegaly. PELVIC:not indicated MUSCULOSKELETAL: Normal range of motion. No tenderness.  No cyanosis, clubbing, or edema.   U/s today in clinic Patient Name: Sharon Norman DOB: 1980-04-21 MRN: [id number]   ULTRASOUND REPORT   Location: [facility] Date of Service: 12/27/2020    Indications:  Confirm MAB;  Scan last week at hospital showed no FHR.  Findings:  On this limited transvaginal scan, a singleton intrauterine pregnancy is visualized with a CRL consistent with [redacted]w[redacted]d gestation, giving an (U/S) EDD of 08/08/21. The (U/S) EDD is consistent with the clinically established EDD of 08/10/21.   NO cardiac activity seen. CRL measurement: 16 mm Yolk sac and Amnion are both visualized and appear normal .   Ovaries were not visualized.  Corpus luteal cyst:  is not visualized Survey of the adnexa demonstrates no adnexal masses. There is no free peritoneal fluid in the cul de sac.   Impression: 1. [redacted]w[redacted]d fetal demise. Marland Kitchen   Recommendations: 1.Clinical correlation with the patient's History and Physical Exam.     Vivien Rota  H[place]-Gainey   Assessment:   1. Threatened abortion - Beta HCG, Quant     Plan:   Discussed normal progression of pregnancy and absence of cardiac  activity. She denies any symptoms of miscarriage including cramping or bleeding. She is requesting repeat u/s because she is not sure of her date and states that it could just be to early. U/s ordered , in office today no cardiac activity seen. Discussed beta quant level today.   Discussed miscarriage treatment options ( as listed below). Pt will go home and discuss with her family .   FACTS YOU SHOULD KNOW  About Early Pregnancy Loss  WHAT IS AN EARLY PREGNANCY LOSS? Once the egg is fertilized with the sperm and begins to develop, it attaches to the lining of the uterus. This early pregnancy tissue may not develop into an embryo (the beginning stage of a baby). Sometimes an embryo does develop but  does not continue to grow. These problems can be seen on ultrasound.   MANAGEMNT OF EARLY PREGNANCY LOSS: About 4 out of 100 (0.25%) women will have a pregnancy loss in her lifetime.  One in five pregnancies is found to be an early pregnancy loss.  There are 3 ways to care for an early pregnancy loss:   Surgery, (2) Medicine, (3) Waiting for you to pass the pregnancy on your own. The decision as to how to proceed after being diagnosed with and early pregnancy loss is an individual one.  The decision can be made only after appropriate counseling.  You need to weigh the pros and cons of the 3 choices. Then you can make the choice that works for you.  SURGERY (D&E) Procedure over in 1 day Requires being put to sleep Bleeding may be light Possible problems during surgery, including injury to womb(uterus) Care provider has more control Medicine (CYTOTEC) The complete procedure may take days to weeks No Surgery Bleeding may be heavy at times There may be drug side effects Patient has more control Waiting You may choose to wait, in which case your own body may complete the passing of the abnormal early pregnancy on its own in about 2-4 weeks Your bleeding may be heavy at times There is a small  possibility that you may need surgery if the bleeding is too much or not all of the pregnancy has passed.  CYTOTEC MANAGEMENT Prostaglandins (cytotec) are the most widely used drug for this purpose. They cause the uterus to cramp and contract. You will place the medicine yourself inside your vagina in the privacy of your home. Empting of the uterus should occur within 3 days but the process may continue for several weeks. The bleeding may seem heavy at times.  INSTRUCTIONS: Take all 4 tablets of cytotec (818mcg total) at one time. This will cause a lot of cramping, you may have bleeding, and pass tissue, then the cramping and bleeding should get better. If you do not pass the tissue, then you can take 4 more tablets of cytotec (81mcg total) 48 hours after your first dose.  You will come back to have your blood drawn to make sure the pregnancy hormones are dropping in 1 week. Please call us if you have any questions.   POSSIBLE SIDE EFFECTS FROM CYTOTEC Nausea  Vomiting Diarrhea Fever Chills  Hot Flashes Side effects  from the process of the early pregnancy loss include: Cramping  Bleeding Headaches  Dizziness RISKS: This is a low risk procedure. Less than 1 in 100 women has a complication. An incomplete passage of the early pregnancy may occur. Also, hemorrhage (heavy bleeding) could happen.  Rarely the pregnancy will not be passed completely. Excessively heavy bleeding may occur.  Your doctor may need to perform surgery to empty the uterus (D&E). Afterwards: Everybody will feel differently after the early pregnancy loss completion. You may have soreness or cramps for a day or two. You may have soreness or cramps for day or two.  You may have light bleeding for up to 2 weeks. You may be as active as you feel like being. If you have any of the following problems you may call [facility] at [phone]. If you have pain that does not get better with pain medication Bleeding that soaks through 2  thick full-sized sanitary pads in an hour Cramps that last longer than 2 days Foul smelling discharge Fever above 100.4 degrees F Even if you do not have any  of these symptoms, you should have a follow-up exam to make sure you are healing properly. Your next normal period will usually start again in 4-6 week after the loss. You can get pregnant soon after the loss, so use birth control right away. Finally: Make sure all your questions are answered before during and after any procedure. Follow up with medical care and family planning methods.

## 2020-12-28 ENCOUNTER — Other Ambulatory Visit: Payer: Self-pay | Admitting: Certified Nurse Midwife

## 2020-12-28 DIAGNOSIS — O039 Complete or unspecified spontaneous abortion without complication: Secondary | ICD-10-CM

## 2020-12-28 LAB — BETA HCG QUANT (REF LAB): hCG Quant: 40562 m[IU]/mL

## 2020-12-30 ENCOUNTER — Other Ambulatory Visit: Payer: No Typology Code available for payment source

## 2020-12-30 ENCOUNTER — Other Ambulatory Visit: Payer: Self-pay

## 2020-12-30 DIAGNOSIS — O039 Complete or unspecified spontaneous abortion without complication: Secondary | ICD-10-CM

## 2020-12-31 LAB — BETA HCG QUANT (REF LAB): hCG Quant: 31009 m[IU]/mL

## 2021-01-02 ENCOUNTER — Other Ambulatory Visit: Payer: Self-pay | Admitting: Certified Nurse Midwife

## 2021-01-02 DIAGNOSIS — F4321 Adjustment disorder with depressed mood: Secondary | ICD-10-CM

## 2021-01-02 DIAGNOSIS — O039 Complete or unspecified spontaneous abortion without complication: Secondary | ICD-10-CM

## 2021-01-02 MED ORDER — MISOPROSTOL 200 MCG PO TABS
800.0000 ug | ORAL_TABLET | Freq: Once | ORAL | 1 refills | Status: AC
Start: 2021-01-02 — End: 2021-01-02

## 2021-01-02 MED ORDER — OXYCODONE-ACETAMINOPHEN 7.5-325 MG PO TABS: 1.0000 | ORAL_TABLET | ORAL | 0 refills | Status: AC | PRN

## 2021-01-04 ENCOUNTER — Encounter: Payer: No Typology Code available for payment source | Admitting: Obstetrics and Gynecology

## 2021-01-09 ENCOUNTER — Other Ambulatory Visit: Payer: Self-pay | Admitting: Certified Nurse Midwife

## 2021-01-09 DIAGNOSIS — O039 Complete or unspecified spontaneous abortion without complication: Secondary | ICD-10-CM

## 2021-01-09 MED ORDER — KETOROLAC TROMETHAMINE 10 MG PO TABS: 10.0000 mg | ORAL_TABLET | Freq: Four times a day (QID) | ORAL | 0 refills | Status: AC | PRN

## 2021-01-16 ENCOUNTER — Other Ambulatory Visit: Payer: Self-pay

## 2021-01-16 ENCOUNTER — Other Ambulatory Visit: Payer: No Typology Code available for payment source

## 2021-01-16 DIAGNOSIS — O039 Complete or unspecified spontaneous abortion without complication: Secondary | ICD-10-CM

## 2021-01-17 ENCOUNTER — Other Ambulatory Visit: Payer: Self-pay | Admitting: Certified Nurse Midwife

## 2021-01-17 DIAGNOSIS — O039 Complete or unspecified spontaneous abortion without complication: Secondary | ICD-10-CM

## 2021-01-17 LAB — BETA HCG QUANT (REF LAB): hCG Quant: 86 m[IU]/mL

## 2021-01-24 ENCOUNTER — Encounter: Payer: No Typology Code available for payment source | Admitting: Obstetrics and Gynecology

## 2021-10-25 ENCOUNTER — Other Ambulatory Visit: Payer: Self-pay

## 2021-10-25 ENCOUNTER — Ambulatory Visit (INDEPENDENT_AMBULATORY_CARE_PROVIDER_SITE_OTHER): Payer: No Typology Code available for payment source

## 2021-10-25 VITALS — BP 137/88 | HR 80 | Wt 136.0 lb

## 2021-10-25 DIAGNOSIS — Z3201 Encounter for pregnancy test, result positive: Secondary | ICD-10-CM

## 2021-10-25 LAB — POCT PREGNANCY, URINE: Preg Test, Ur: POSITIVE — AB

## 2021-10-25 NOTE — Progress Notes (Signed)
Patient is here today for a pregnancy test. Urine pregnancy test positive. Per patient she had a positive pregnancy test at home on 09/28/21. Patient denies any pain or vaginal bleeding. Per patient her LMP was 08/27/21 making her 75w3dtoday with an EDD of 06/03/22. Patient is unsure of where she would like to receive prenatal care. List provided to patient. I instructed patient to choose from list and call to set up new OB appointment. I recommended patient begin taking prenatal vitamins. Safe medication list provided to patient. Patient verbalized understanding and denies any other questions.   MPaulina Fusi RN 10/25/21

## 2021-10-25 NOTE — Patient Instructions (Addendum)
[facility] Prenatal Care Providers          [facility] locations:  Hours may vary. Please call for an appointment  [facility] at [facility]             [address], [place], [place] [address] [phone]  [facility] at [facility]                                                             [address], [address], [place], [place], [address] [phone]  [facility] at [place]                                    [address], [address], [place], [place], [address] [phone]  [facility] at [place] [address], [address], [place], [place], [address] [phone]  [facility] at [place]                                 [address], [place], [place], [address] [phone]  [facility] at [facility]                                    [address], [place], [place], [address] [phone]  [facility] at [address], [address], [place], [place], [address]                                                   Safe Medications in Pregnancy    Acne: Benzoyl Peroxide Salicylic Acid  Backache/Headache: Tylenol: 2 regular strength every 4 hours OR              2 Extra strength every 6 hours  Colds/Coughs/Allergies: Benadryl (alcohol free) 25 mg every 6 hours as needed Breath right strips Claritin Cepacol throat lozenges Chloraseptic throat spray Cold-Eeze- up to three times per day Cough drops, alcohol free Flonase (by prescription only) Guaifenesin Mucinex Robitussin DM (plain only, alcohol free) Saline nasal spray/drops Sudafed (pseudoephedrine) & Actifed ** use only after [redacted] weeks gestation and if you do not have high blood pressure Tylenol Vicks Vaporub Zinc lozenges Zyrtec   Constipation: Colace Ducolax suppositories Fleet enema Glycerin  suppositories Metamucil Milk of magnesia Miralax Senokot Smooth move tea  Diarrhea: Kaopectate Imodium A-D  *NO pepto Bismol  Hemorrhoids: Anusol Anusol HC Preparation H Tucks  Indigestion: Tums Maalox Mylanta Zantac  Pepcid  Insomnia: Benadryl (alcohol free) 25mg every 6 hours as needed Tylenol PM Unisom, no Gelcaps  Leg Cramps: Tums MagGel  Nausea/Vomiting:  Bonine Dramamine Emetrol Ginger extract Sea bands Meclizine  Nausea medication to take during pregnancy:  Unisom (doxylamine succinate 25 mg tablets) Take one tablet daily at bedtime. If symptoms are not adequately controlled, the dose can be increased to a maximum recommended dose of two   tablets daily (1/2 tablet in the morning, 1/2 tablet mid-afternoon and one at bedtime). Vitamin B6 100mg tablets. Take one tablet twice a day (up to 200 mg per day).  Skin Rashes: Aveeno products Benadryl cream or 25mg every 6 hours as needed Calamine Lotion 1% cortisone cream  Yeast infection: Gyne-lotrimin 7 Monistat 7   **If taking multiple medications, please check labels to avoid duplicating the same active ingredients **take medication as directed on the label ** Do not exceed 4000 mg of tylenol in 24 hours **Do not take medications that contain aspirin or ibuprofen    

## 2021-12-26 IMAGING — US US OB COMP +14 WK
1 series · 13 of 28 positions shown · non-contrast
Comparison: none

CLINICAL DATA: Assess fetal growth, uterine fibroids

EXAM:
OBSTETRICAL ULTRASOUND >14 WKS

[Series 1: us ob comp + 14 wk · 13 of 89 slices shown]
[im 4/89]
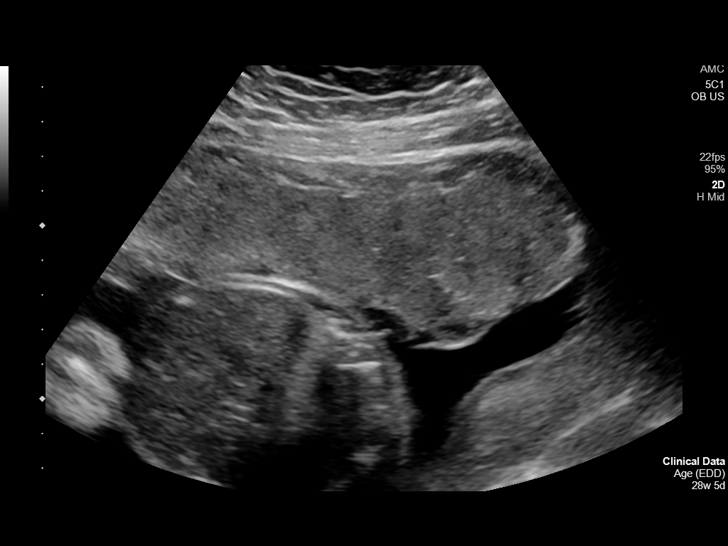
[im 10/89]
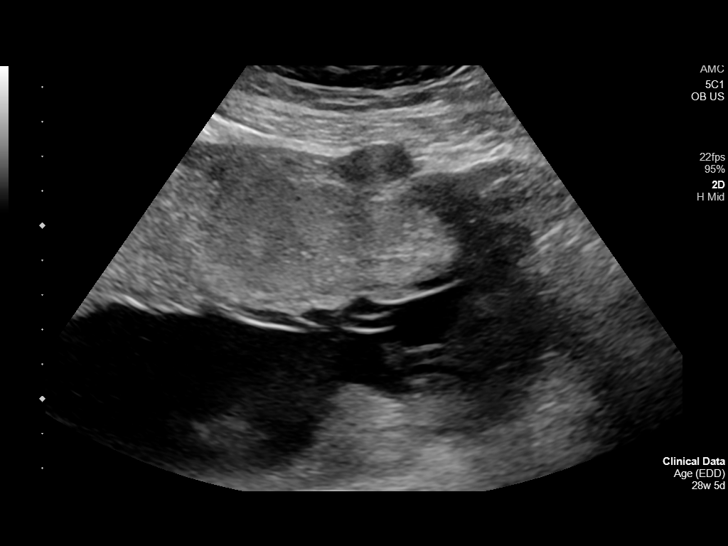
[im 17/89]
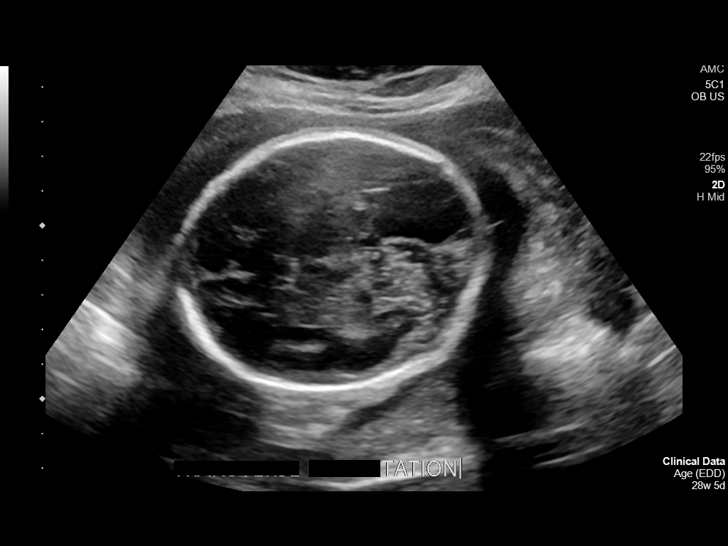
[im 23/89]
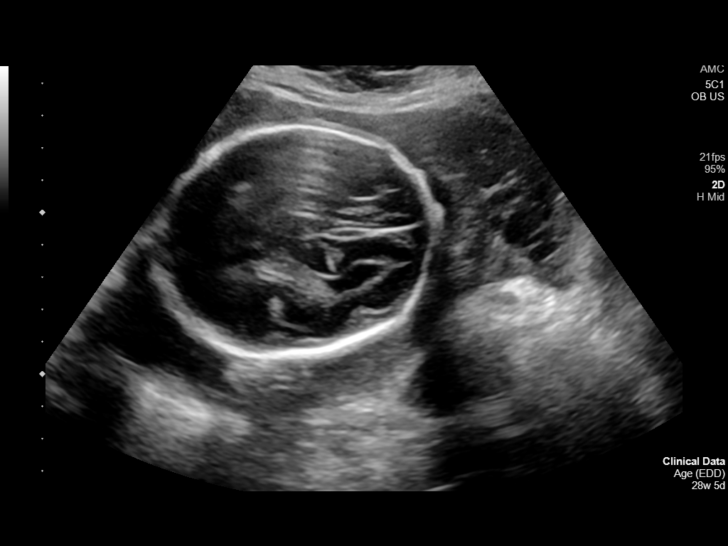
[im 30/89]
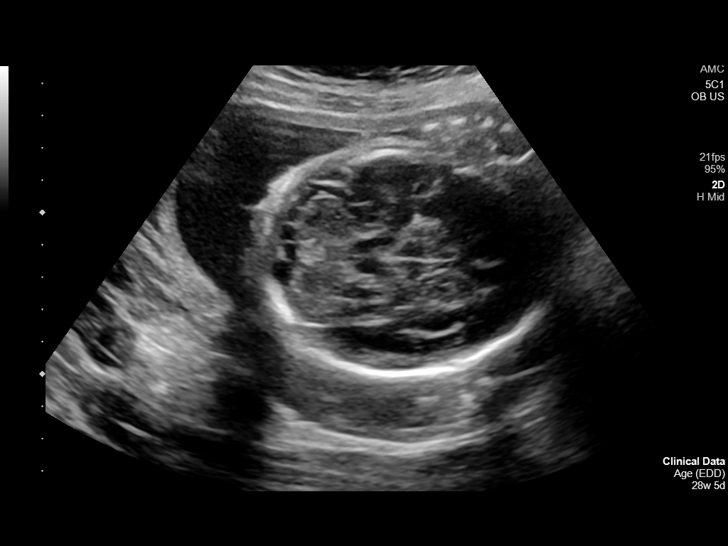
[im 36/89]
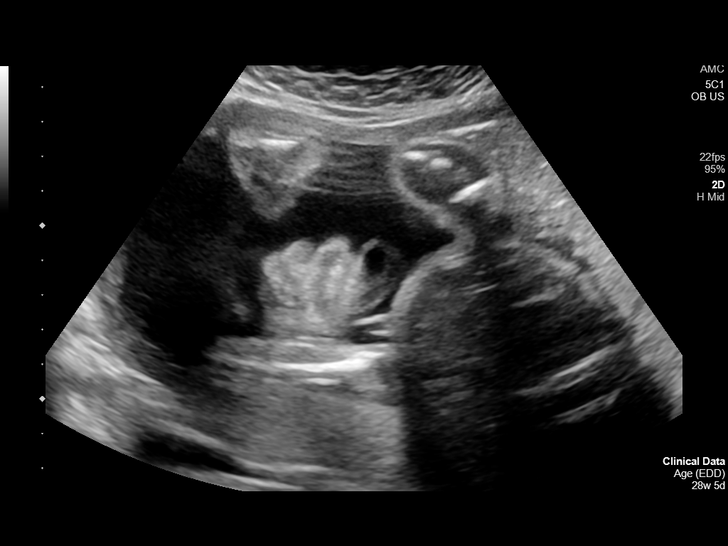
[im 46/89]
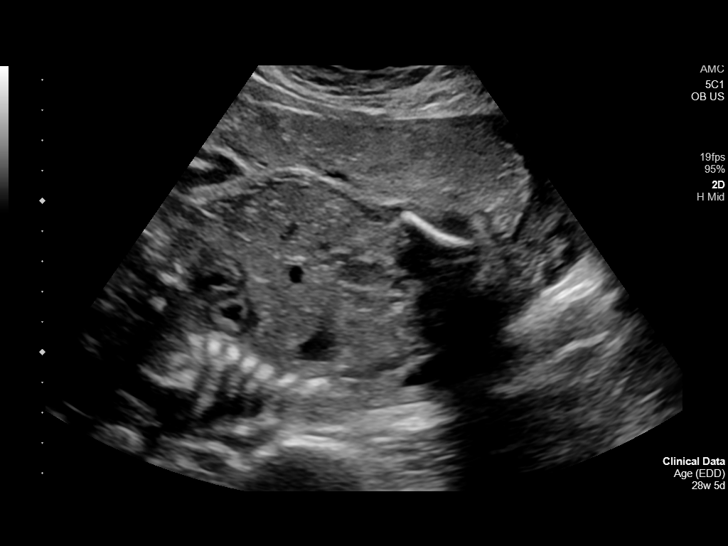
[im 53/89]
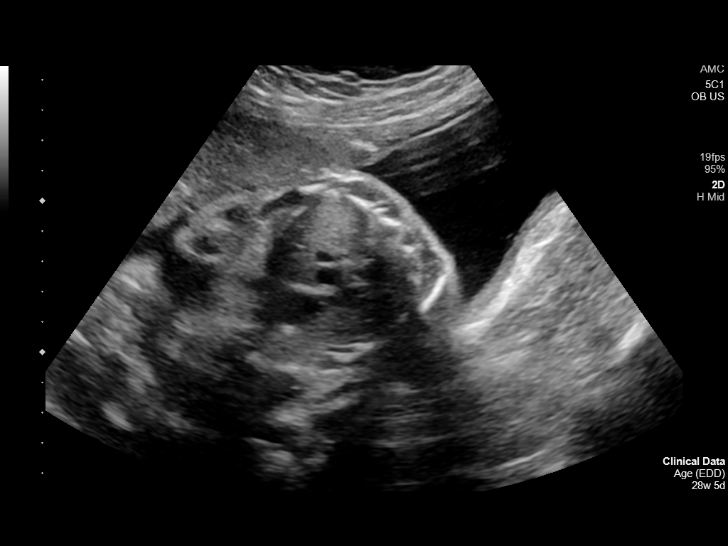
[im 59/89]
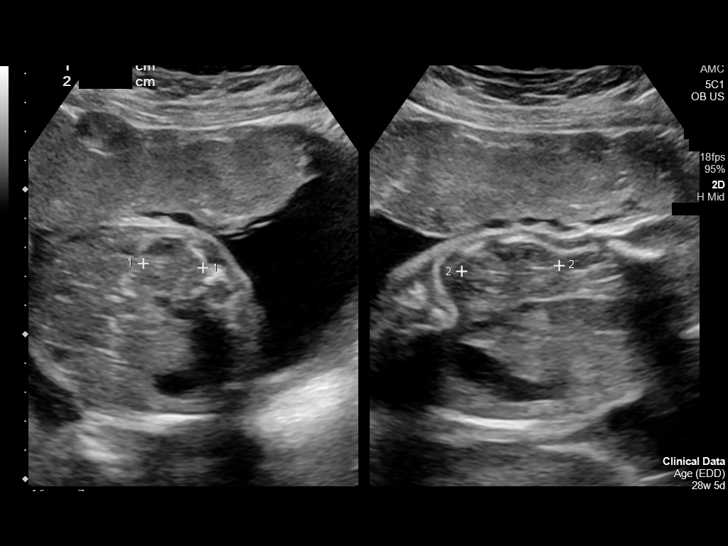
[im 66/89]
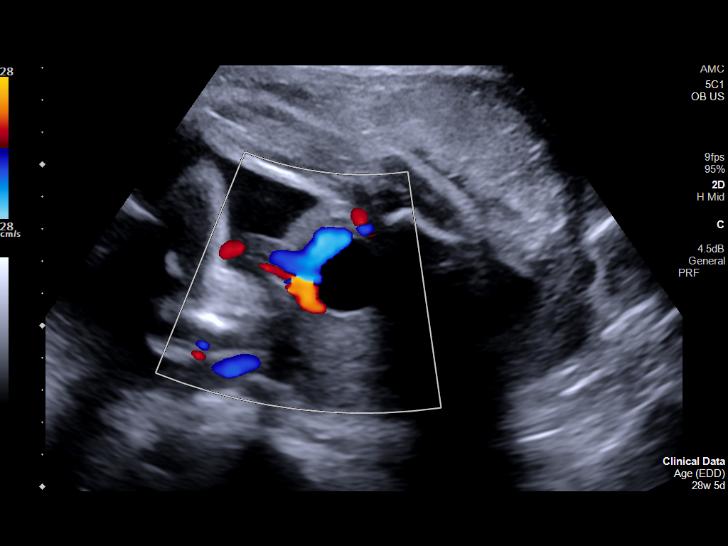
[im 72/89]
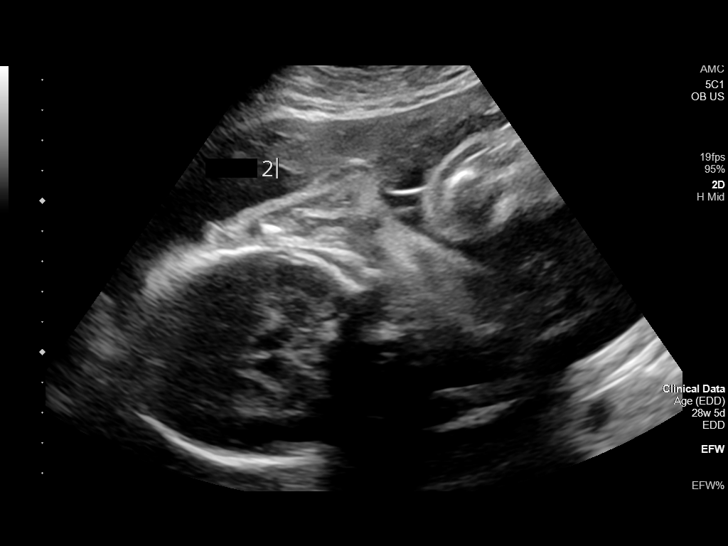
[im 79/89]
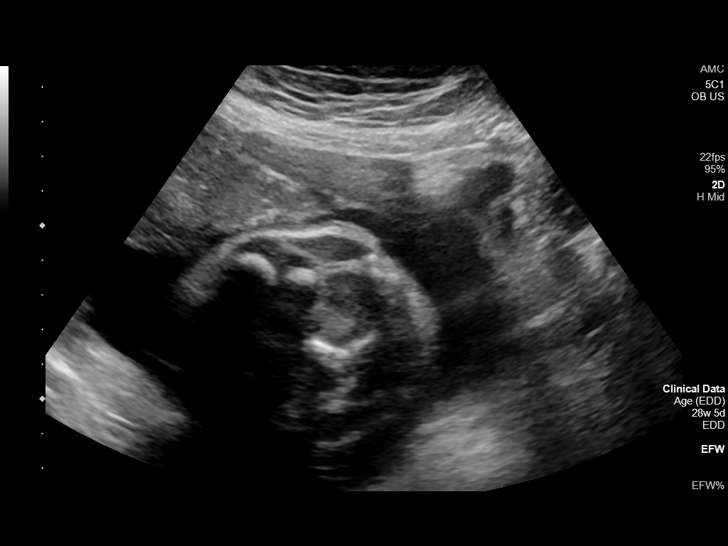
[im 85/89]
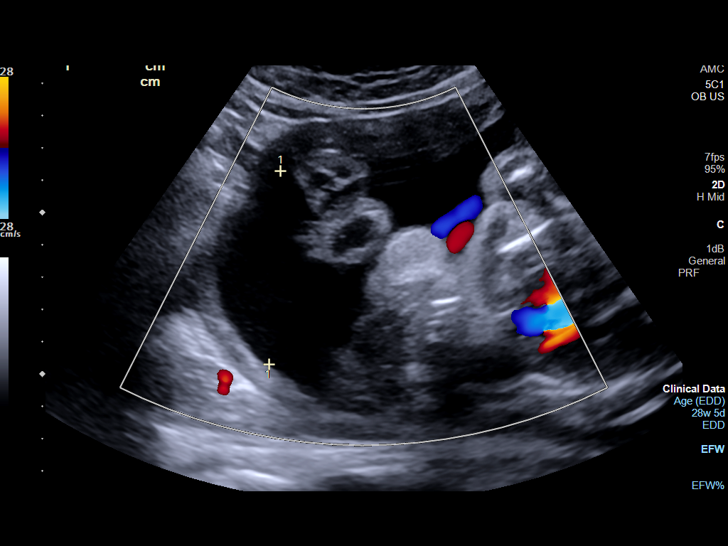

[13 of 28 positions shown; findings below may reference images not displayed]

FINDINGS: Number of Fetuses: 1

Heart Rate:  140 bpm

Movement: Yes

Presentation: Transverse, head to maternal right

Previa: No

Placental Location: Anterior

Amniotic Fluid (Subjective): Normal

Amniotic Fluid (Objective):

Vertical pocket = 6.0cm

AFI = 17.8 cm (5%ile= 9.2 cm, 95%= 25.4 cm for 29 wks)

FETAL BIOMETRY

BPD: 7.2cm 28w 5d

HC:   27.1cm 29w 4d

AC:   24.7cm 28w 6d

FL:   5.8cm 30w 3d

Current Mean GA: 29w 3d US EDC: 10/24/2019

Assigned GA:  28w 5d Assigned EDC: 10/29/2019

Estimated Fetal Weight:  1,398g 65.8%ile

Previously 771 g, 50.0 percentile

FETAL ANATOMY

Lateral Ventricles: Appears normal

Thalami/CSP: Appears normal

Posterior Fossa:  Appears normal

Nuchal Region: Appears normal

Upper Lip: Appears normal

Spine: Appears normal

4 Chamber Heart on Left: Appears normal

LVOT: Appears normal

RVOT: Not visualized

Stomach on Left: Appears normal

3 Vessel Cord: Appears normal

Cord Insertion site: Appears normal

Kidneys: Appears normal

Bladder: Appears normal

Extremities: Appears normal

Technically difficult due to: Advanced gestational age

Maternal Findings:

Cervix:

Incidental note is made of 2 uterine fibroids measuring up to 4.1 cm
on the right and 2.4 cm on the left.
IMPRESSION: 1. Single live intrauterine pregnancy as above estimated age 29
weeks and 3 days. Appropriate interval growth since prior study.
2. Suboptimal visualization of the right ventricular outflow tract.
Otherwise normal fetal anatomic survey.

## 2023-05-10 IMAGING — US US OB < 14 WEEKS - US OB TV
1 series · 15 of 28 positions shown · non-contrast
Comparison: None for this gestation

CLINICAL DATA: Positive pregnancy test, unknown LMP potentially
first week [DATE], assessment of dating and viability

EXAM:
OBSTETRIC <14 WK US AND TRANSVAGINAL OB US
TECHNIQUE: Both transabdominal and transvaginal ultrasound examinations were
performed for complete evaluation of the gestation as well as the
maternal uterus, adnexal regions, and pelvic cul-de-sac.
Transvaginal technique was performed to assess early pregnancy.

[Series 1: us ob comp less 14 wks · 53 acquisitions, 15 frames shown]
[im 1/53]
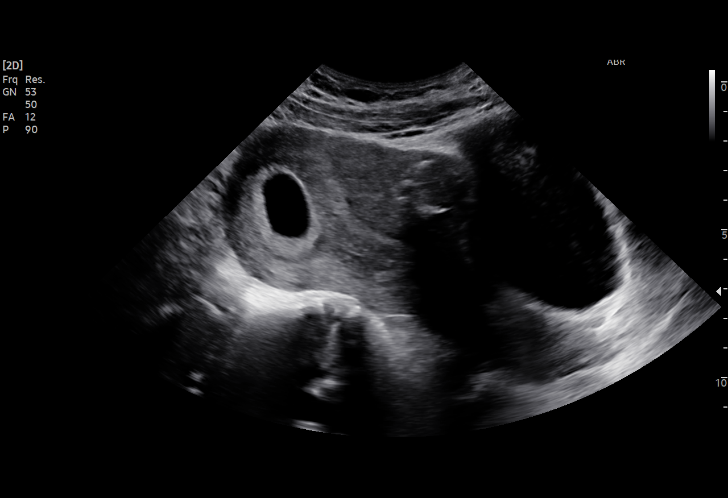
[im 4/53]
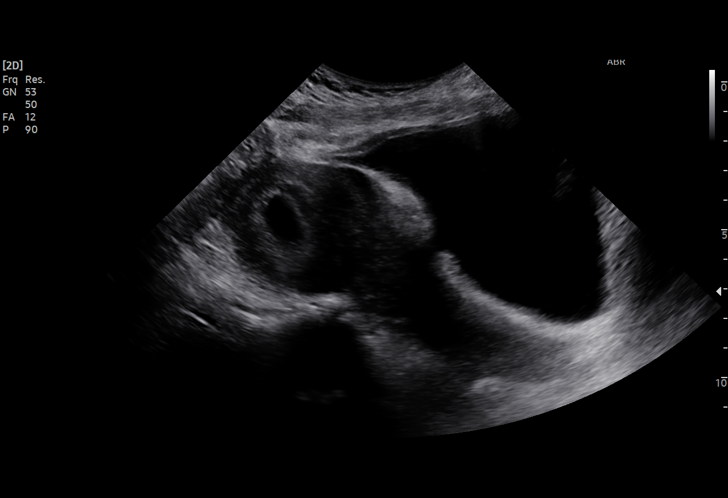
[im 8/53]
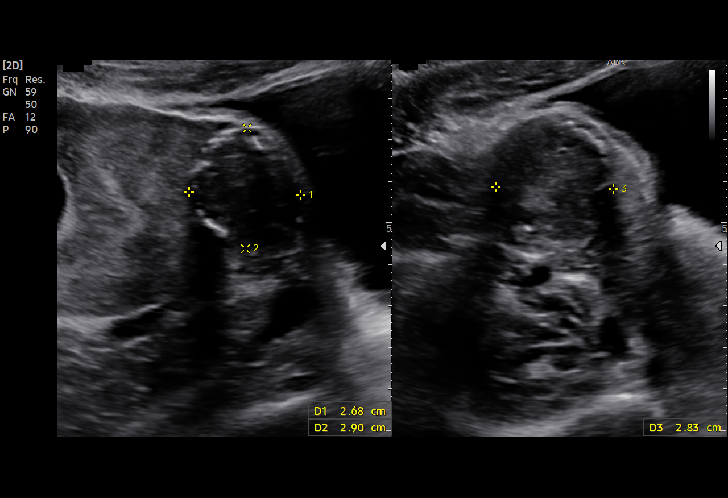
[im 12/53]
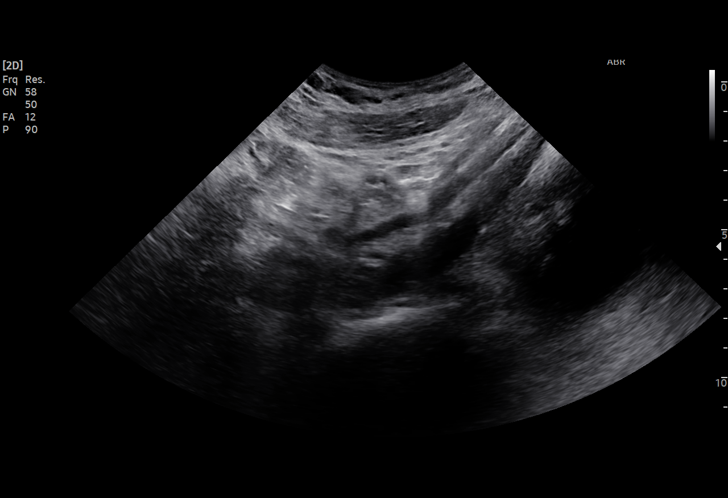
[im 16/53]
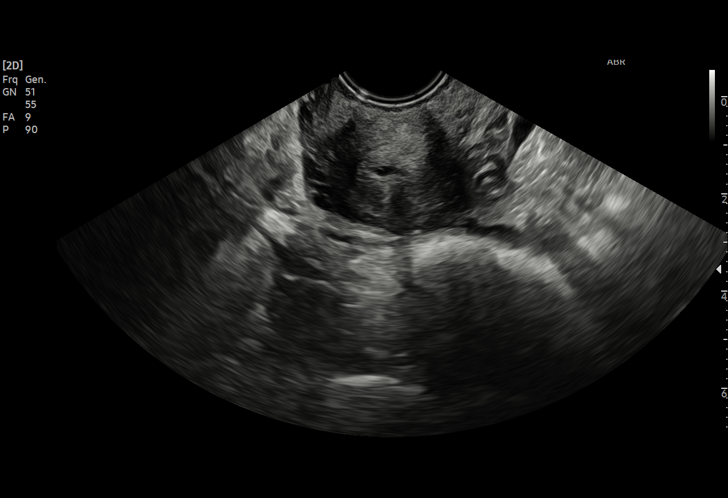
[im 20/53]
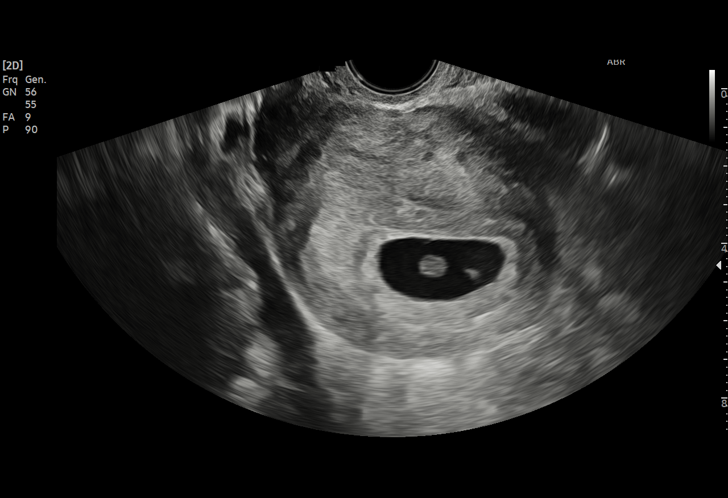
[im 24/53]
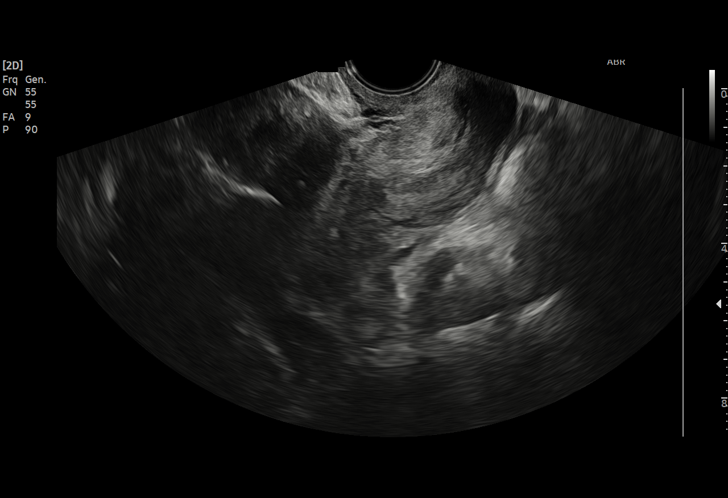
[im 27/53]
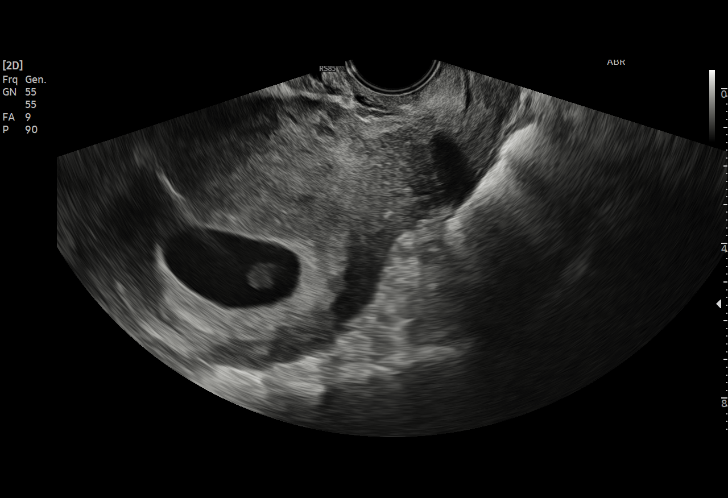
[im 29/53]
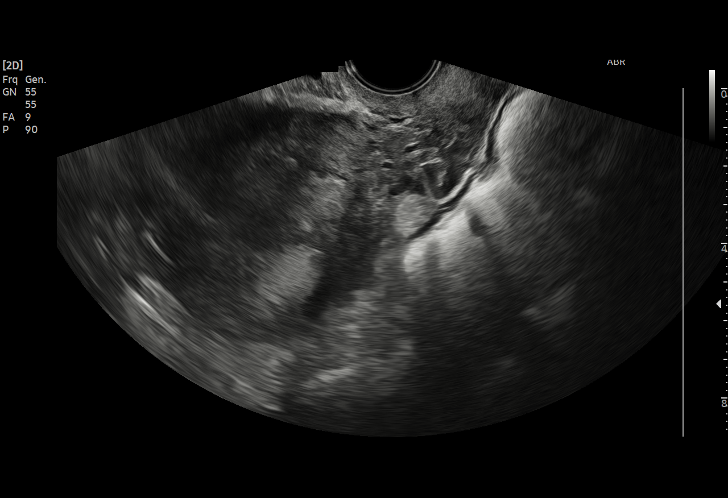
[im 33/53]
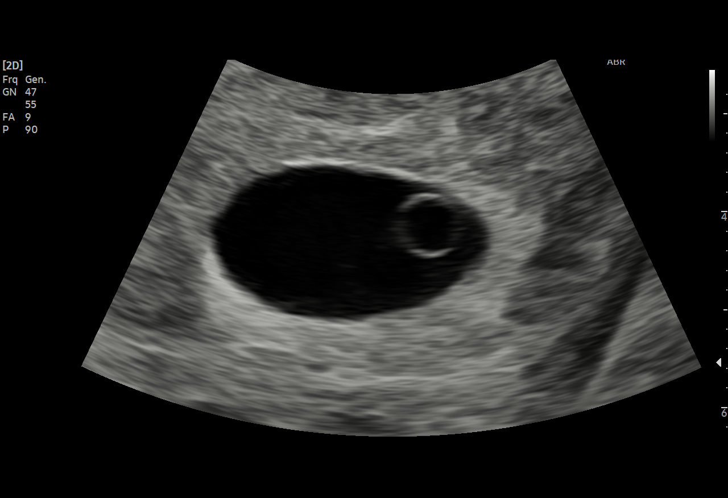
[im 37/53]
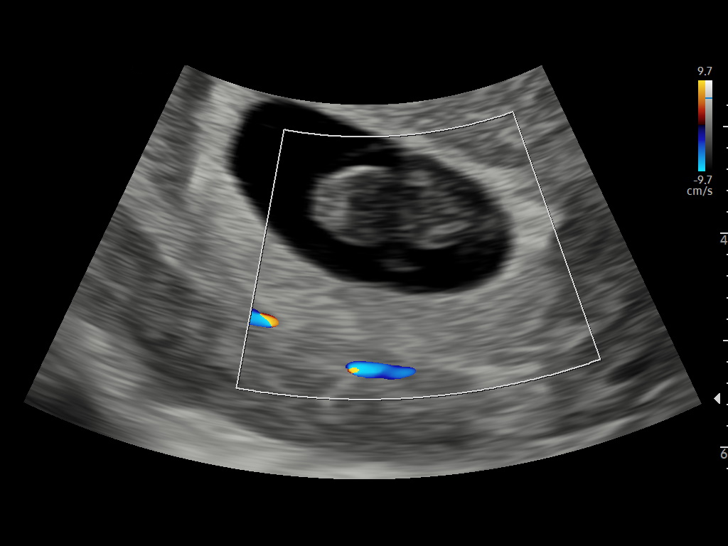
[im 41/53]
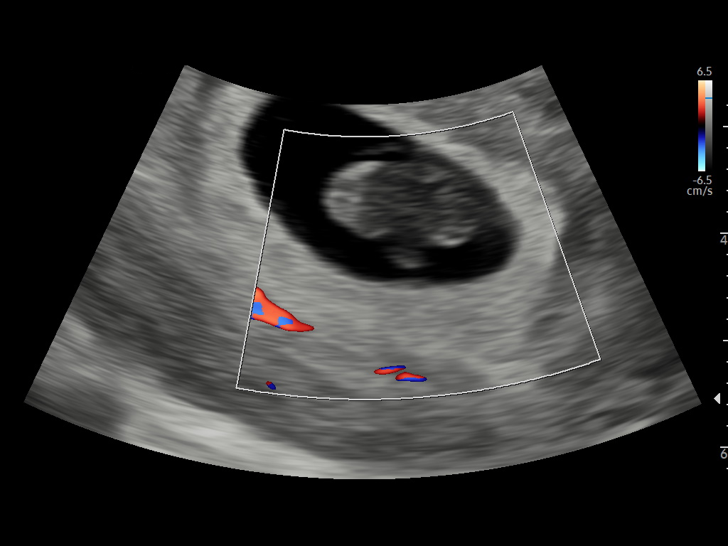
[im 45/53]
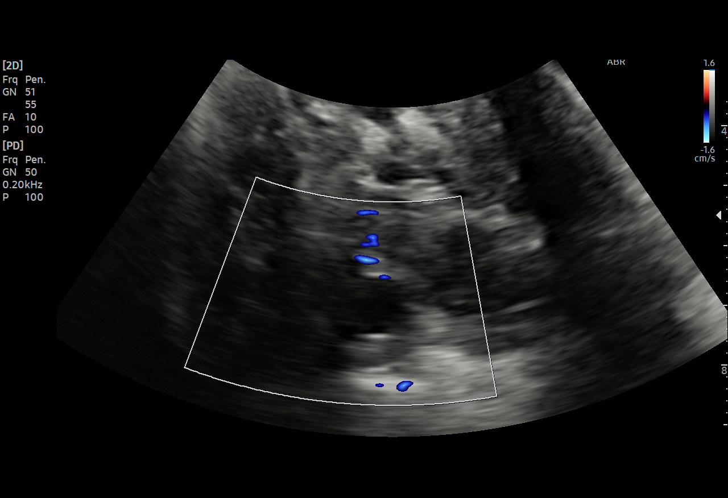
[im 49/53]
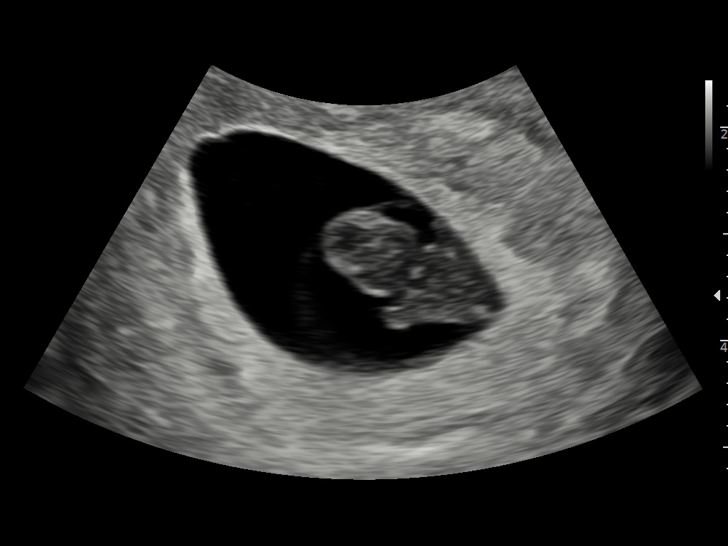
[im 53/53]
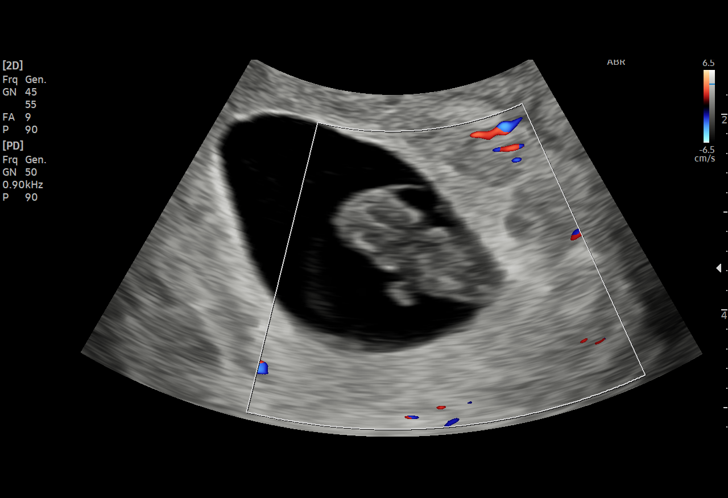

[15 of 28 positions shown; findings below may reference images not displayed]

FINDINGS: Intrauterine gestational sac: Present, single

Yolk sac:  Present

Embryo:  Present

Cardiac Activity: Absent

Heart Rate: N/A  bpm

CRL:  18.4 mm   8 w   2 d                  US EDC: 08/02/2021

Subchorionic hemorrhage:  None visualized.

Maternal uterus/adnexae:

Maternal uterus otherwise unremarkable.

Prior LEFT oophorectomy.

RIGHT ovary measures 4.2 x 3.3 x 2.7 cm and contains a small corpus
luteal cyst.

No free pelvic fluid or adnexal masses.
IMPRESSION: Intrauterine gestational sac identified containing a yolk sac and
fetal pole.

No fetal cardiac activity identified.

Findings meet definitive criteria for failed pregnancy. This follows
SRU consensus guidelines: Diagnostic Criteria for Nonviable
Pregnancy Early in the First Trimester. N Engl J Med
